# Patient Record
Sex: Female | Born: 1960 | Race: Black or African American | Hispanic: No | Marital: Married | Smoking: Current every day smoker
Health system: Southern US, Community
[De-identification: ages and names within clinical notes are randomized; demographics above are authoritative.]

## PROBLEM LIST (undated history)

## (undated) DIAGNOSIS — E119 Type 2 diabetes mellitus without complications: Secondary | ICD-10-CM

## (undated) DIAGNOSIS — I10 Essential (primary) hypertension: Secondary | ICD-10-CM

## (undated) DIAGNOSIS — K219 Gastro-esophageal reflux disease without esophagitis: Secondary | ICD-10-CM

## (undated) DIAGNOSIS — F329 Major depressive disorder, single episode, unspecified: Secondary | ICD-10-CM

## (undated) DIAGNOSIS — E1129 Type 2 diabetes mellitus with other diabetic kidney complication: Secondary | ICD-10-CM

## (undated) DIAGNOSIS — G4730 Sleep apnea, unspecified: Secondary | ICD-10-CM

## (undated) DIAGNOSIS — F32A Depression, unspecified: Secondary | ICD-10-CM

## (undated) DIAGNOSIS — R809 Proteinuria, unspecified: Secondary | ICD-10-CM

## (undated) DIAGNOSIS — E785 Hyperlipidemia, unspecified: Secondary | ICD-10-CM

## (undated) HISTORY — DX: Essential (primary) hypertension: I10

## (undated) HISTORY — DX: Gastro-esophageal reflux disease without esophagitis: K21.9

## (undated) HISTORY — DX: Hyperlipidemia, unspecified: E78.5

## (undated) HISTORY — DX: Type 2 diabetes mellitus without complications: E11.9

## (undated) HISTORY — DX: Major depressive disorder, single episode, unspecified: F32.9

## (undated) HISTORY — DX: Proteinuria, unspecified: R80.9

## (undated) HISTORY — DX: Type 2 diabetes mellitus with other diabetic kidney complication: E11.29

## (undated) HISTORY — DX: Depression, unspecified: F32.A

---

## 2005-05-28 HISTORY — PX: DILATION AND CURETTAGE OF UTERUS: SHX78

## 2005-08-22 ENCOUNTER — Ambulatory Visit: Payer: Self-pay | Admitting: Family Medicine

## 2005-09-20 ENCOUNTER — Ambulatory Visit: Payer: Self-pay | Admitting: Unknown Physician Specialty

## 2007-05-14 ENCOUNTER — Ambulatory Visit: Payer: Self-pay | Admitting: Family Medicine

## 2007-12-08 ENCOUNTER — Ambulatory Visit: Payer: Self-pay | Admitting: Family Medicine

## 2008-08-18 DIAGNOSIS — E782 Mixed hyperlipidemia: Secondary | ICD-10-CM | POA: Insufficient documentation

## 2008-09-02 ENCOUNTER — Ambulatory Visit: Payer: Self-pay | Admitting: Physician Assistant

## 2009-02-08 ENCOUNTER — Ambulatory Visit: Payer: Self-pay | Admitting: Family Medicine

## 2010-03-08 DIAGNOSIS — L301 Dyshidrosis [pompholyx]: Secondary | ICD-10-CM | POA: Insufficient documentation

## 2011-03-09 LAB — HM MAMMOGRAPHY: HM Mammogram: NORMAL

## 2011-03-20 ENCOUNTER — Ambulatory Visit: Payer: Self-pay | Admitting: Family Medicine

## 2011-04-17 LAB — HM PAP SMEAR: HM Pap smear: HIGH

## 2011-07-17 ENCOUNTER — Ambulatory Visit: Payer: Self-pay | Admitting: Family Medicine

## 2011-08-24 ENCOUNTER — Ambulatory Visit: Payer: Self-pay | Admitting: Family Medicine

## 2011-11-19 ENCOUNTER — Ambulatory Visit: Payer: Self-pay

## 2012-02-19 ENCOUNTER — Ambulatory Visit: Payer: Self-pay | Admitting: Family Medicine

## 2012-02-26 ENCOUNTER — Ambulatory Visit: Payer: Self-pay | Admitting: Family Medicine

## 2012-03-20 ENCOUNTER — Ambulatory Visit: Payer: Self-pay | Admitting: Family Medicine

## 2012-03-28 ENCOUNTER — Ambulatory Visit: Payer: Self-pay | Admitting: Family Medicine

## 2012-08-28 ENCOUNTER — Telehealth: Payer: Self-pay | Admitting: *Deleted

## 2012-08-28 NOTE — Telephone Encounter (Signed)
Patient was contacted to see if we could arrange for a colonoscopy in May 2014. This patient declined at this time. She states she is in an ongoing issue with [facility] about a bill for something she did not have completed. Patient wishes to wait until this has been straightened out before she proceeds with colonoscopy. This patient is aware to contact the office when she is ready to arrange.

## 2012-12-29 ENCOUNTER — Ambulatory Visit: Payer: Self-pay | Admitting: Family Medicine

## 2014-04-07 LAB — HEMOGLOBIN A1C: Hgb A1c MFr Bld: 6.9 % — AB (ref 4.0–6.0)

## 2014-04-09 LAB — LIPID PANEL
Cholesterol: 151 mg/dL (ref 0–200)
HDL: 53 mg/dL (ref 35–70)
LDL CALC: 81 mg/dL
Triglycerides: 86 mg/dL (ref 40–160)

## 2014-09-14 ENCOUNTER — Encounter: Payer: Self-pay | Admitting: Family Medicine

## 2014-11-03 ENCOUNTER — Ambulatory Visit (INDEPENDENT_AMBULATORY_CARE_PROVIDER_SITE_OTHER): Payer: BLUE CROSS/BLUE SHIELD | Admitting: Family Medicine

## 2014-11-03 ENCOUNTER — Encounter (INDEPENDENT_AMBULATORY_CARE_PROVIDER_SITE_OTHER): Payer: Self-pay

## 2014-11-03 ENCOUNTER — Encounter: Payer: Self-pay | Admitting: Family Medicine

## 2014-11-03 VITALS — BP 124/80 | HR 108 | Temp 97.5°F | Resp 16 | Ht 62.0 in | Wt 264.2 lb

## 2014-11-03 DIAGNOSIS — R87610 Atypical squamous cells of undetermined significance on cytologic smear of cervix (ASC-US): Secondary | ICD-10-CM | POA: Insufficient documentation

## 2014-11-03 DIAGNOSIS — J302 Other seasonal allergic rhinitis: Secondary | ICD-10-CM | POA: Insufficient documentation

## 2014-11-03 DIAGNOSIS — F329 Major depressive disorder, single episode, unspecified: Secondary | ICD-10-CM | POA: Diagnosis not present

## 2014-11-03 DIAGNOSIS — K219 Gastro-esophageal reflux disease without esophagitis: Secondary | ICD-10-CM

## 2014-11-03 DIAGNOSIS — E1165 Type 2 diabetes mellitus with hyperglycemia: Secondary | ICD-10-CM

## 2014-11-03 DIAGNOSIS — M549 Dorsalgia, unspecified: Secondary | ICD-10-CM | POA: Insufficient documentation

## 2014-11-03 DIAGNOSIS — G4733 Obstructive sleep apnea (adult) (pediatric): Secondary | ICD-10-CM

## 2014-11-03 DIAGNOSIS — I10 Essential (primary) hypertension: Secondary | ICD-10-CM | POA: Diagnosis not present

## 2014-11-03 DIAGNOSIS — F32A Depression, unspecified: Secondary | ICD-10-CM | POA: Insufficient documentation

## 2014-11-03 DIAGNOSIS — E668 Other obesity: Secondary | ICD-10-CM | POA: Insufficient documentation

## 2014-11-03 DIAGNOSIS — IMO0002 Reserved for concepts with insufficient information to code with codable children: Secondary | ICD-10-CM

## 2014-11-03 DIAGNOSIS — E282 Polycystic ovarian syndrome: Secondary | ICD-10-CM | POA: Insufficient documentation

## 2014-11-03 DIAGNOSIS — IMO0001 Reserved for inherently not codable concepts without codable children: Secondary | ICD-10-CM | POA: Insufficient documentation

## 2014-11-03 LAB — GLUCOSE, POCT (MANUAL RESULT ENTRY): POC Glucose: 150 mg/dL — AB (ref 70–99)

## 2014-11-03 LAB — POCT GLYCOSYLATED HEMOGLOBIN (HGB A1C): Hemoglobin A1C: 7.5

## 2014-11-03 MED ORDER — GLIPIZIDE ER 10 MG PO TB24: 10.0000 mg | ORAL_TABLET | Freq: Two times a day (BID) | ORAL | Status: DC

## 2014-11-03 NOTE — Progress Notes (Signed)
Name: Ariel ShanBridgette F Plotts   MRN: [id number]    DOB: 02/08/1961   Date:11/03/2014       Progress Note  Subjective  Chief Complaint  Chief Complaint  Patient presents with  . Hypertension  . Diabetes  . Hyperlipidemia  . Obesity    Hypertension This is a chronic problem. The current episode started more than 1 year ago. Pertinent negatives include no blurred vision, chest pain, headaches, neck pain, orthopnea, palpitations or shortness of breath. There are no associated agents to hypertension. Risk factors for coronary artery disease include diabetes mellitus, dyslipidemia, obesity and sedentary lifestyle. Past treatments include angiotensin blockers and diuretics. The current treatment provides moderate improvement. There are no compliance problems.   Diabetes She presents for her follow-up diabetic visit. She has type 2 diabetes mellitus. Her disease course has been worsening. There are no hypoglycemic associated symptoms. Pertinent negatives for hypoglycemia include no dizziness, headaches, nervousness/anxiousness, seizures or tremors. Pertinent negatives for diabetes include no blurred vision, no chest pain, no polydipsia, no polyuria, no weakness and no weight loss. There are no hypoglycemic complications. Symptoms are worsening. Risk factors for coronary artery disease include diabetes mellitus and stress. Her weight is increasing steadily. She is following a diabetic diet. She has had a previous visit with a dietitian. She rarely participates in exercise. Her overall blood glucose range is 140-180 mg/dl. An ACE inhibitor/angiotensin II receptor blocker is being taken.  Hyperlipidemia This is a chronic problem. The current episode started more than 1 year ago. The problem is controlled. Recent lipid tests were reviewed and are normal. Exacerbating diseases include diabetes and obesity. Factors aggravating her hyperlipidemia include thiazides. Pertinent negatives include no chest pain, focal  weakness, myalgias or shortness of breath. Current antihyperlipidemic treatment includes statins. The current treatment provides moderate improvement of lipids. There are no compliance problems.  Risk factors for coronary artery disease include diabetes mellitus, dyslipidemia, hypertension and obesity.    OBESITY Patient admits to dietary indiscretion. She has not been following her diet as prescribed. She has seen the dietitian within the last year. She has just began an exercise regimen again.   Past Medical History  Diagnosis Date  . GERD (gastroesophageal reflux disease)   . Depression   . Hypertension   . Hyperlipidemia   . Diabetes mellitus without complication     History  Substance Use Topics  . Smoking status: Current Every Day Smoker -- 0.50 packs/day  . Smokeless tobacco: Not on file  . Alcohol Use: No     Current outpatient prescriptions:  .  atorvastatin (LIPITOR) 40 MG tablet, Take 40 mg by mouth daily., Disp: , Rfl:  .  canagliflozin (INVOKANA) 100 MG TABS tablet, Take 100 mg by mouth daily., Disp: , Rfl:  .  losartan-hydrochlorothiazide (HYZAAR) 100-12.5 MG per tablet, Take 1 tablet by mouth daily., Disp: , Rfl:  .  sitaGLIPtin-metformin (JANUMET) 50-1000 MG per tablet, Take 1 tablet by mouth 2 (two) times daily with a meal., Disp: , Rfl:  .  glipiZIDE (GLUCOTROL XL) 10 MG 24 hr tablet, Take 10 mg by mouth daily with breakfast., Disp: , Rfl:   Allergies  Allergen Reactions  . Victoza [Liraglutide] Rash    rash    Review of Systems  Constitutional: Negative for fever, chills and weight loss.  HENT: Negative for congestion, hearing loss, sore throat and tinnitus.   Eyes: Negative for blurred vision, double vision and redness.  Respiratory: Negative for cough, hemoptysis and shortness of breath.  Cardiovascular: Negative for chest pain, palpitations, orthopnea, claudication and leg swelling.  Gastrointestinal: Negative for heartburn, nausea, vomiting, diarrhea,  constipation and blood in stool.  Genitourinary: Negative for dysuria, urgency, frequency and hematuria.  Musculoskeletal: Negative for myalgias, back pain, joint pain, falls and neck pain.  Skin: Negative for itching.  Neurological: Negative for dizziness, tingling, tremors, focal weakness, seizures, loss of consciousness, weakness and headaches.  Endo/Heme/Allergies: Negative for polydipsia. Does not bruise/bleed easily.       WT GAIN  Psychiatric/Behavioral: Negative for depression and substance abuse. The patient is not nervous/anxious and does not have insomnia.      Objective  Filed Vitals:   11/03/14 0802  BP: 124/80  Pulse: 108  Temp: 97.5 F (36.4 C)  TempSrc: Oral  Resp: 16  Height:  (1.575 m)  Weight: 264 lb 3.2 oz (119.84 kg)  SpO2: 95%     Physical Exam  Constitutional: She is oriented to person, place, and time and well-developed, well-nourished, and in no distress.  HENT:  Head: Normocephalic.  Eyes: EOM are normal. Pupils are equal, round, and reactive to light.  Neck: Normal range of motion. No thyromegaly present.  Cardiovascular: Normal rate, regular rhythm and normal heart sounds.   No murmur heard. Pulmonary/Chest: Effort normal and breath sounds normal.  Abdominal: Soft. Bowel sounds are normal.  Musculoskeletal: Normal range of motion. She exhibits no edema.  Neurological: She is alert and oriented to person, place, and time. No cranial nerve deficit. Gait normal.  Skin: Skin is warm and dry. No rash noted.  Psychiatric: Memory and affect normal.      Assessment & PlaN  1. Uncontrolled diabetes mellitus - POCT HgB A1C - POCT Glucose (CBG) - Fructosamine  2. Benign essential HTN Controlled  3. Obstructive apnea Stable  4. Gastroesophageal reflux disease without esophagitis Stable  5. Clinical depression Stable  6. Extreme obesity Worsening

## 2014-11-03 NOTE — Patient Instructions (Signed)
F/u 1 mo

## 2014-11-04 LAB — FRUCTOSAMINE: FRUCTOSAMINE: 287 umol/L — AB (ref 0–285)

## 2014-12-09 ENCOUNTER — Ambulatory Visit: Payer: BLUE CROSS/BLUE SHIELD | Admitting: Family Medicine

## 2014-12-29 ENCOUNTER — Ambulatory Visit: Payer: BLUE CROSS/BLUE SHIELD | Admitting: Family Medicine

## 2015-01-18 ENCOUNTER — Ambulatory Visit (INDEPENDENT_AMBULATORY_CARE_PROVIDER_SITE_OTHER): Payer: BLUE CROSS/BLUE SHIELD | Admitting: Family Medicine

## 2015-01-18 ENCOUNTER — Encounter: Payer: Self-pay | Admitting: Family Medicine

## 2015-01-18 VITALS — BP 124/68 | HR 104 | Temp 98.1°F | Resp 16 | Ht 62.0 in | Wt 257.8 lb

## 2015-01-18 DIAGNOSIS — Z23 Encounter for immunization: Secondary | ICD-10-CM | POA: Diagnosis not present

## 2015-01-18 DIAGNOSIS — E1165 Type 2 diabetes mellitus with hyperglycemia: Secondary | ICD-10-CM

## 2015-01-18 DIAGNOSIS — Z1211 Encounter for screening for malignant neoplasm of colon: Secondary | ICD-10-CM

## 2015-01-18 DIAGNOSIS — F329 Major depressive disorder, single episode, unspecified: Secondary | ICD-10-CM

## 2015-01-18 DIAGNOSIS — E785 Hyperlipidemia, unspecified: Secondary | ICD-10-CM | POA: Diagnosis not present

## 2015-01-18 DIAGNOSIS — I10 Essential (primary) hypertension: Secondary | ICD-10-CM | POA: Diagnosis not present

## 2015-01-18 DIAGNOSIS — IMO0002 Reserved for concepts with insufficient information to code with codable children: Secondary | ICD-10-CM

## 2015-01-18 DIAGNOSIS — F32A Depression, unspecified: Secondary | ICD-10-CM

## 2015-01-18 LAB — GLUCOSE, POCT (MANUAL RESULT ENTRY): POC GLUCOSE: 215 mg/dL — AB (ref 70–99)

## 2015-01-18 LAB — POCT GLYCOSYLATED HEMOGLOBIN (HGB A1C): Hemoglobin A1C: 10.2

## 2015-01-18 MED ORDER — SITAGLIPTIN-METFORMIN HCL 50-1000 MG PO TABS
1.0000 | ORAL_TABLET | Freq: Two times a day (BID) | ORAL | Status: DC
Start: 2015-01-18 — End: 2015-08-09

## 2015-01-18 NOTE — Progress Notes (Addendum)
Name: Ariel Johnston   MRN: [id number]    DOB: 14-Dec-1960   Date:01/18/2015       Progress Note  Subjective  Chief Complaint  Chief Complaint  Patient presents with  . Hypertension    3 month follow up  . Diabetes  . Hyperlipidemia    Hypertension Pertinent negatives include no blurred vision, chest pain, headaches, neck pain, orthopnea, palpitations or shortness of breath.  Diabetes Pertinent negatives for hypoglycemia include no dizziness, headaches, nervousness/anxiousness, seizures or tremors. Associated symptoms include polydipsia. Pertinent negatives for diabetes include no blurred vision, no chest pain, no weakness and no weight loss.  Hyperlipidemia Pertinent negatives include no chest pain, focal weakness, myalgias or shortness of breath.   History of hyperlipidemia for greater than 5 years. Patient currently on atorvastatin 40 mg by mouth every morning she has switch she follows with some frequency. No muscle myalgias or nausea or vomiting associated with this usage. Cardiac risk factors include hyperlipidemia diabetes hypertension obesity and sedentary lifestyle.  Diabetes patient currently on glipizide 10 mg daily. She also is on Janumet 50-101 by mouth twice a day [place] was not paid for by her insurance. Blood sugars have not been checked with regularity she admits to dietary indiscretion and not being completely compliant with her meds since last visit. Her weight today 264 please decreased 7 pounds but she has been having polyuria with elevated glucose of recent.  Hypertension  Patient currently on a regimen of losartan HCT. She states she is taking on a regular basis as well as an aspirin once daily. Currently no chest pain palpitations orthopnea PND or lower extremity swelling is complaint of.  Obesity  Patient admits to dietary indiscretion and is not exercising with any regularity. Her weight today of 257.8 BMI of 48.   Past Medical History  Diagnosis Date   . GERD (gastroesophageal reflux disease)   . Depression   . Hypertension   . Hyperlipidemia   . Diabetes mellitus without complication     Social History  Substance Use Topics  . Smoking status: Current Every Day Smoker -- 0.50 packs/day  . Smokeless tobacco: Not on file  . Alcohol Use: No     Current outpatient prescriptions:  .  aspirin (ASPIRIN ADULT LOW DOSE) 81 MG EC tablet, Take by mouth., Disp: , Rfl:  .  atorvastatin (LIPITOR) 40 MG tablet, Take 40 mg by mouth daily., Disp: , Rfl:  .  canagliflozin (INVOKANA) 100 MG TABS tablet, Take 100 mg by mouth daily., Disp: , Rfl:  .  glipiZIDE (GLUCOTROL XL) 10 MG 24 hr tablet, Take 1 tablet (10 mg total) by mouth 2 (two) times daily. 1 bid, Disp: 180 tablet, Rfl: 1 .  losartan-hydrochlorothiazide (HYZAAR) 100-12.5 MG per tablet, Take 1 tablet by mouth daily., Disp: , Rfl:   Allergies  Allergen Reactions  . Victoza [Liraglutide] Rash    rash    Review of Systems  Constitutional: Negative for fever, chills and weight loss.  HENT: Negative for congestion, hearing loss, sore throat and tinnitus.   Eyes: Negative for blurred vision, double vision and redness.  Respiratory: Negative for cough, hemoptysis and shortness of breath.   Cardiovascular: Negative for chest pain, palpitations, orthopnea, claudication and leg swelling.  Gastrointestinal: Negative for heartburn, nausea, vomiting, diarrhea, constipation and blood in stool.  Genitourinary: Positive for frequency. Negative for dysuria, urgency and hematuria.  Musculoskeletal: Negative for myalgias, back pain, joint pain, falls and neck pain.  Skin: Negative for itching.  Neurological: Negative for dizziness, tingling, tremors, focal weakness, seizures, loss of consciousness, weakness and headaches.  Endo/Heme/Allergies: Positive for polydipsia. Does not bruise/bleed easily.  Psychiatric/Behavioral: Negative for depression and substance abuse. The patient is not nervous/anxious  and does not have insomnia.      Objective  Filed Vitals:   01/18/15 0803  BP: 124/68  Pulse: 104  Temp: 98.1 F (36.7 C)  TempSrc: Oral  Resp: 16  Height:  (1.575 m)  Weight: 257 lb 12.8 oz (116.937 kg)  SpO2: 96%     Physical Exam  Constitutional: She is oriented to person, place, and time and well-developed, well-nourished, and in no distress.  HENT:  Head: Normocephalic.  Eyes: EOM are normal. Pupils are equal, round, and reactive to light.  Neck: Normal range of motion. No thyromegaly present.  Cardiovascular: Normal rate, regular rhythm, normal heart sounds and intact distal pulses.   No murmur heard. Pulmonary/Chest: Effort normal and breath sounds normal.  Abdominal: Soft. Bowel sounds are normal.  Musculoskeletal: Normal range of motion. She exhibits no edema.  Neurological: She is alert and oriented to person, place, and time. No cranial nerve deficit. Gait normal.  Skin: Skin is warm and dry. No rash noted.  Psychiatric: Memory and affect normal.      Assessment & Plan  1. Uncontrolled diabetes mellitus Again encouraged need for compliance, and exercise medication - POCT HgB A1C - POCT Glucose (CBG) - Comprehensive metabolic panel - TSH - sitaGLIPtin-metformin (JANUMET) 50-1000 MG  per tablet; Take 1 tablet by mouth 2 (two) times daily with a meal.  Dispense: 60 tablet; Refill: 1  2. Essential hypertension  - Comprehensive metabolic panel - TSH  3. Hyperlipidemia  - Lipid panel - TSH  4. Clinical depression   5. Need for immunization against influenza  - Flu Vaccine QUAD 36+ mos PF IM (Fluarix & Fluzone Quad PF)  6. Encounter for screening colonoscopy  - Ambulatory referral to Colorectal Surgery

## 2015-01-20 DIAGNOSIS — I10 Essential (primary) hypertension: Secondary | ICD-10-CM | POA: Insufficient documentation

## 2015-01-20 DIAGNOSIS — Z23 Encounter for immunization: Secondary | ICD-10-CM | POA: Insufficient documentation

## 2015-01-20 DIAGNOSIS — E785 Hyperlipidemia, unspecified: Secondary | ICD-10-CM | POA: Insufficient documentation

## 2015-01-20 DIAGNOSIS — Z1211 Encounter for screening for malignant neoplasm of colon: Secondary | ICD-10-CM | POA: Insufficient documentation

## 2015-02-04 ENCOUNTER — Telehealth: Payer: Self-pay | Admitting: Emergency Medicine

## 2015-02-04 LAB — COMPREHENSIVE METABOLIC PANEL
ALK PHOS: 62 IU/L (ref 39–117)
ALT: 24 IU/L (ref 0–32)
AST: 16 IU/L (ref 0–40)
Albumin/Globulin Ratio: 1.3 (ref 1.1–2.5)
Albumin: 4.1 g/dL (ref 3.5–5.5)
BILIRUBIN TOTAL: 0.3 mg/dL (ref 0.0–1.2)
BUN / CREAT RATIO: 17 (ref 9–23)
BUN: 14 mg/dL (ref 6–24)
CHLORIDE: 98 mmol/L (ref 97–108)
CO2: 23 mmol/L (ref 18–29)
Calcium: 9.5 mg/dL (ref 8.7–10.2)
Creatinine, Ser: 0.81 mg/dL (ref 0.57–1.00)
GFR calc non Af Amer: 83 mL/min/{1.73_m2} (ref >59)
GFR, EST AFRICAN AMERICAN: 96 mL/min/{1.73_m2} (ref >59)
Globulin, Total: 3.2 g/dL (ref 1.5–4.5)
Glucose: 162 mg/dL — ABNORMAL HIGH (ref 65–99)
Potassium: 4.4 mmol/L (ref 3.5–5.2)
Sodium: 141 mmol/L (ref 134–144)
Total Protein: 7.3 g/dL (ref 6.0–8.5)

## 2015-02-04 LAB — TSH: TSH: 1.11 u[IU]/mL (ref 0.450–4.500)

## 2015-02-04 LAB — LIPID PANEL
CHOLESTEROL TOTAL: 114 mg/dL (ref 100–199)
Chol/HDL Ratio: 2.7 ratio units (ref 0.0–4.4)
HDL: 43 mg/dL (ref >39)
LDL Calculated: 50 mg/dL (ref 0–99)
Triglycerides: 106 mg/dL (ref 0–149)
VLDL Cholesterol Cal: 21 mg/dL (ref 5–40)

## 2015-02-04 NOTE — Telephone Encounter (Signed)
Patient notified of labs.   

## 2015-02-04 NOTE — Telephone Encounter (Signed)
Patient notified

## 2015-03-07 ENCOUNTER — Other Ambulatory Visit: Payer: Self-pay | Admitting: Family Medicine

## 2015-04-14 ENCOUNTER — Ambulatory Visit (INDEPENDENT_AMBULATORY_CARE_PROVIDER_SITE_OTHER): Payer: BLUE CROSS/BLUE SHIELD | Admitting: Family Medicine

## 2015-04-14 ENCOUNTER — Encounter: Payer: Self-pay | Admitting: Family Medicine

## 2015-04-14 VITALS — BP 128/78 | HR 113 | Temp 98.5°F | Resp 18 | Ht 62.0 in | Wt 255.0 lb

## 2015-04-14 DIAGNOSIS — I10 Essential (primary) hypertension: Secondary | ICD-10-CM | POA: Diagnosis not present

## 2015-04-14 DIAGNOSIS — Z720 Tobacco use: Secondary | ICD-10-CM | POA: Diagnosis not present

## 2015-04-14 DIAGNOSIS — E1169 Type 2 diabetes mellitus with other specified complication: Secondary | ICD-10-CM

## 2015-04-14 DIAGNOSIS — E785 Hyperlipidemia, unspecified: Secondary | ICD-10-CM | POA: Diagnosis not present

## 2015-04-14 DIAGNOSIS — Z716 Tobacco abuse counseling: Secondary | ICD-10-CM | POA: Diagnosis not present

## 2015-04-14 LAB — POCT GLYCOSYLATED HEMOGLOBIN (HGB A1C): Hemoglobin A1C: 8.5

## 2015-04-14 LAB — POCT UA - MICROALBUMIN: Microalbumin Ur, POC: 20 mg/L

## 2015-04-14 LAB — GLUCOSE, POCT (MANUAL RESULT ENTRY): POC GLUCOSE: 234 mg/dL — AB (ref 70–99)

## 2015-04-14 MED ORDER — VARENICLINE TARTRATE 0.5 MG PO TABS: 0.5000 mg | ORAL_TABLET | Freq: Two times a day (BID) | ORAL | Status: DC

## 2015-04-14 NOTE — Progress Notes (Signed)
Name: Ariel Johnston   MRN: [id number]    DOB: Oct 22, 1960   Date:04/14/2015       Progress Note  Subjective  Chief Complaint  Chief Complaint  Patient presents with  . Hypertension  . Hyperlipidemia  . Diabetes    HPI  Diabetes  Patient presents for follow-up of hypertension. It has been present for over over 5 years.  Patient states that there is compliance with medical regimen which consists of Janumet 50- and 1000 1000 and glipizide 10 mg g. There is no end organ disease. Cardiac risk factors include hypertension hyperlipidemia and diabetes.  Exercise regimen consist of minimal .  Diet consist of ADA has been prescribed .  Hyperlipidemia  Patient has a history of hyperlipideover 5ars.  Current medical regimen consist of atorvastatin 40 mg daily at bedtimeCompliance GoodDiet and exercise are currently fnot wellk factors for cardiovascular disease include hyperlipidehypertension sedentary lifestyle hyperlipidemia hypertension. There have been no side effects from the medication.    Hypertension   Patient presents for follow-up of hypertension. It has been present for over 5 years.  Patient states that there is compliance with medical regimen which consists of Hyzaar 100-12 0.5 daily . There is no end organ disease. Cardiac risk factors include hypertension hyperlipidemia and diabetes.  Exercise regimen consist of minimal walking .  Diet consist of some salt restriction .Marland Kitchen   Obesity  Patient has had a long-standing history of obesity. She has been referred to dietitian past. Encouraged to be on a regular aerobic exercise. Her weight today of 255 is decreased 3 pounds. It is worsened by dietary indiscretion and lack of exercise  Tobacco abuse  Patient has a several day History of smoking as much as 1/2-1 pack per day. She denies seriously interested in stopping smoking and is considering using Chantix for this reason. She had her husband have both discussed stopping together as  of the end of this year  Past Medical History  Diagnosis Date  . GERD (gastroesophageal reflux disease)   . Depression   . Hypertension   . Hyperlipidemia   . Diabetes mellitus without complication [facility])     Social History  Substance Use Topics  . Smoking status: Current Every Day Smoker -- 0.50 packs/day  . Smokeless tobacco: Not on file  . Alcohol Use: No     Current outpatient prescriptions:  .  aspirin (ASPIRIN ADULT LOW DOSE) 81 MG EC tablet, Take by mouth., Disp: , Rfl:  .  atorvastatin (LIPITOR) 40 MG tablet, Take 40 mg by mouth daily., Disp: , Rfl:  .  glipiZIDE (GLUCOTROL XL) 10 MG 24 hr tablet, Take 1 tablet (10 mg total) by mouth 2 (two) times daily. 1 bid, Disp: 180 tablet, Rfl: 1 .  INVOKANA 100 MG TABS tablet, TAKE 1 TABLET DAILY, Disp: 90 tablet, Rfl: 0 .  losartan-hydrochlorothiazide (HYZAAR) 100-12.5 MG per tablet, Take 1 tablet by mouth daily., Disp: , Rfl:  .  sitaGLIPtin-metformin (JANUMET) 50-1000 MG per tablet, Take 1 tablet by mouth 2 (two) times daily with a meal., Disp: 60 tablet, Rfl: 1  Allergies  Allergen Reactions  . Victoza [Liraglutide] Rash    rash    Review of Systems  Constitutional: Negative for fever, chills and weight loss.  HENT: Negative for congestion, hearing loss, sore throat and tinnitus.   Eyes: Negative for blurred vision, double vision and redness.  Respiratory: Negative for cough, hemoptysis and shortness of breath.   Cardiovascular: Negative for chest pain, palpitations, orthopnea,  claudication and leg swelling.  Gastrointestinal: Negative for heartburn, nausea, vomiting, diarrhea, constipation and blood in stool.  Genitourinary: Negative for dysuria, urgency, frequency and hematuria.  Musculoskeletal: Positive for joint pain. Negative for myalgias, back pain, falls and neck pain.  Skin: Negative for itching.  Neurological: Negative for dizziness, tingling, tremors, focal weakness, seizures, loss of consciousness, weakness and  headaches.  Endo/Heme/Allergies: Does not bruise/bleed easily.  Psychiatric/Behavioral: Negative for depression and substance abuse. The patient is not nervous/anxious and does not have insomnia.      Objective  Filed Vitals:   04/14/15 0811  BP: 128/78  Pulse: 113  Temp: 98.5 F (36.9 C)  Resp: 18  Height: 5\' 2"  (1.575 m)  Weight: 255 lb (115.667 kg)  SpO2: 96%     Physical Exam  Constitutional: She is oriented to person, place, and time.  Morbidly obese and in no acute distress  HENT:  Head: Normocephalic.  Eyes: EOM are normal. Pupils are equal, round, and reactive to light.  Neck: Normal range of motion. No thyromegaly present.  Cardiovascular: Normal rate, regular rhythm and normal heart sounds.   No murmur heard. Pulmonary/Chest: Effort normal and breath sounds normal.  Abdominal: Soft. Bowel sounds are normal.  Musculoskeletal: Normal range of motion. She exhibits no edema.  Neurological: She is alert and oriented to person, place, and time. No cranial nerve deficit. Gait normal.  Skin: Skin is warm and dry. No rash noted.  Psychiatric: Memory and affect normal.      Assessment & Plan  1. Type 2 diabetes mellitus with other specified complication (HCC) Uncontrolled encourage diet and exercise compliance - POCT Glucose (CBG) - POCT HgB A1C  2. Essential hypertension Well-controlled  3. Morbid obesity due to excess calories (HCC) Encourage diet and exercise  4. Tobacco abuse Chantix and handout on smoking cessation and recheck in 2 months  5. Hyperlipidemia Well-controlled

## 2015-04-14 NOTE — Patient Instructions (Signed)
Smoking smoking cessationSmoking Cessation, Tips for Success If you are ready to quit smoking, congratulations! You have chosen to help yourself be healthier. Cigarettes bring nicotine, tar, carbon monoxide, and other irritants into your body. Your lungs, heart, and blood vessels will be able to work better without these poisons. There are many different ways to quit smoking. Nicotine gum, nicotine patches, a nicotine inhaler, or nicotine nasal spray can help with physical craving. Hypnosis, support groups, and medicines help break the habit of smoking. WHAT THINGS CAN I DO TO MAKE QUITTING EASIER?  Here are some tips to help you quit for good:  Pick a date when you will quit smoking completely. Tell all of your friends and family about your plan to quit on that date.  Do not try to slowly cut down on the number of cigarettes you are smoking. Pick a quit date and quit smoking completely starting on that day.  Throw away all cigarettes.   Clean and remove all ashtrays from your home, work, and car.  On a card, write down your reasons for quitting. Carry the card with you and read it when you get the urge to smoke.  Cleanse your body of nicotine. Drink enough water and fluids to keep your urine clear or pale yellow. Do this after quitting to flush the nicotine from your body.  Learn to predict your moods. Do not let a bad situation be your excuse to have a cigarette. Some situations in your life might tempt you into wanting a cigarette.  Never have "just one" cigarette. It leads to wanting another and another. Remind yourself of your decision to quit.  Change habits associated with smoking. If you smoked while driving or when feeling stressed, try other activities to replace smoking. Stand up when drinking your coffee. Brush your teeth after eating. Sit in a different chair when you read the paper. Avoid alcohol while trying to quit, and try to drink fewer caffeinated beverages. Alcohol and  caffeine may urge you to smoke.  Avoid foods and drinks that can trigger a desire to smoke, such as sugary or spicy foods and alcohol.  Ask people who smoke not to smoke around you.  Have something planned to do right after eating or having a cup of coffee. For example, plan to take a walk or exercise.  Try a relaxation exercise to calm you down and decrease your stress. Remember, you may be tense and nervous for the first 2 weeks after you quit, but this will pass.  Find new activities to keep your hands busy. Play with a pen, coin, or rubber band. Doodle or draw things on paper.  Brush your teeth right after eating. This will help cut down on the craving for the taste of tobacco after meals. You can also try mouthwash.   Use oral substitutes in place of cigarettes. Try using lemon drops, carrots, cinnamon sticks, or chewing gum. Keep them handy so they are available when you have the urge to smoke.  When you have the urge to smoke, try deep breathing.  Designate your home as a nonsmoking area.  If you are a heavy smoker, ask your health care provider about a prescription for nicotine chewing gum. It can ease your withdrawal from nicotine.  Reward yourself. Set aside the cigarette money you save and buy yourself something nice.  Look for support from others. Join a support group or smoking cessation program. Ask someone at home or at work to help you with  your plan to quit smoking.  Always ask yourself, "Do I need this cigarette or is this just a reflex?" Tell yourself, "Today, I choose not to smoke," or "I do not want to smoke." You are reminding yourself of your decision to quit.  Do not replace cigarette smoking with electronic cigarettes (commonly called e-cigarettes). The safety of e-cigarettes is unknown, and some may contain harmful chemicals.  If you relapse, do not give up! Plan ahead and think about what you will do the next time you get the urge to smoke. HOW WILL I FEEL  WHEN I QUIT SMOKING? You may have symptoms of withdrawal because your body is used to nicotine (the addictive substance in cigarettes). You may crave cigarettes, be irritable, feel very hungry, cough often, get headaches, or have difficulty concentrating. The withdrawal symptoms are only temporary. They are strongest when you first quit but will go away within 10-14 days. When withdrawal symptoms occur, stay in control. Think about your reasons for quitting. Remind yourself that these are signs that your body is healing and getting used to being without cigarettes. Remember that withdrawal symptoms are easier to treat than the major diseases that smoking can cause.  Even after the withdrawal is over, expect periodic urges to smoke. However, these cravings are generally short lived and will go away whether you smoke or not. Do not smoke! WHAT RESOURCES ARE AVAILABLE TO HELP ME QUIT SMOKING? Your health care provider can direct you to community resources or hospitals for support, which may include:  Group support.  Education.  Hypnosis.  Therapy.   This information is not intended to replace advice given to you by your health care provider. Make sure you discuss any questions you have with your health care provider.   Document Released: 02/10/2004 Document Revised: 06/04/2014 Document Reviewed: 10/30/2012 [employer] Interactive Patient Education [employer]

## 2015-05-19 LAB — HM PAP SMEAR: HM Pap smear: NORMAL

## 2015-06-01 ENCOUNTER — Encounter: Payer: Self-pay | Admitting: Family Medicine

## 2015-06-16 ENCOUNTER — Ambulatory Visit: Payer: BLUE CROSS/BLUE SHIELD | Admitting: Family Medicine

## 2015-06-20 ENCOUNTER — Other Ambulatory Visit: Payer: Self-pay | Admitting: Family Medicine

## 2015-08-09 ENCOUNTER — Other Ambulatory Visit: Payer: Self-pay | Admitting: Family Medicine

## 2015-08-16 ENCOUNTER — Ambulatory Visit: Payer: BLUE CROSS/BLUE SHIELD | Admitting: Family Medicine

## 2015-09-27 ENCOUNTER — Other Ambulatory Visit: Payer: Self-pay | Admitting: Family Medicine

## 2015-09-30 ENCOUNTER — Other Ambulatory Visit: Payer: Self-pay | Admitting: Emergency Medicine

## 2015-09-30 MED ORDER — LOSARTAN POTASSIUM-HCTZ 100-12.5 MG PO TABS: 1.0000 | ORAL_TABLET | Freq: Every day | ORAL | Status: DC

## 2015-09-30 MED ORDER — CANAGLIFLOZIN 100 MG PO TABS: 100.0000 mg | ORAL_TABLET | Freq: Every day | ORAL | Status: DC

## 2015-09-30 MED ORDER — GLIPIZIDE ER 10 MG PO TB24: 10.0000 mg | ORAL_TABLET | Freq: Two times a day (BID) | ORAL | Status: DC

## 2015-09-30 MED ORDER — ATORVASTATIN CALCIUM 40 MG PO TABS: 40.0000 mg | ORAL_TABLET | Freq: Every day | ORAL | Status: DC

## 2015-10-25 ENCOUNTER — Other Ambulatory Visit: Payer: Self-pay

## 2015-10-25 ENCOUNTER — Telehealth: Payer: Self-pay

## 2015-10-25 DIAGNOSIS — Z1211 Encounter for screening for malignant neoplasm of colon: Secondary | ICD-10-CM

## 2015-10-25 NOTE — Telephone Encounter (Signed)
Gastroenterology Pre-Procedure Review  Request Date: 12/06/15 Requesting Physician: Dr. Servando SnareWohl  PATIENT REVIEW QUESTIONS: The patient responded to the following health history questions as indicated:    1. Are you having any GI issues? no 2. Do you have a personal history of Polyps? no 3. Do you have a family history of Colon Cancer or Polyps? yes (Paternal Grandmother- Colon Cancer) 4. Diabetes Mellitus? Yes 5. Joint replacements in the past 12 months?no 6. Major health problems in the past 3 months?no 7. Any artificial heart valves, MVP, or defibrillator?no    MEDICATIONS & ALLERGIES:    Patient reports the following regarding taking any anticoagulation/antiplatelet therapy:   Plavix, Coumadin, Eliquis, Xarelto, Lovenox, Pradaxa, Brilinta, or Effient? no Aspirin? yes (Prophalxsis)  Patient confirms/reports the following medications:  Current Outpatient Prescriptions  Medication Sig Dispense Refill  . aspirin (ASPIRIN ADULT LOW DOSE) 81 MG EC tablet Take 81 mg by mouth daily.     Marland Kitchen. atorvastatin (LIPITOR) 40 MG tablet TAKE 1 TABLET AT BEDTIME 90 tablet 0  . canagliflozin (INVOKANA) 100 MG TABS tablet Take 1 tablet (100 mg total) by mouth daily. 90 tablet 0  . glipiZIDE (GLUCOTROL XL) 10 MG 24 hr tablet Take 1 tablet (10 mg total) by mouth 2 (two) times daily. 1 bid 180 tablet 0  . JANUMET 50-1000 MG tablet TAKE 1 TABLET TWICE A DAY 180 tablet 0  . losartan-hydrochlorothiazide (HYZAAR) 100-12.5 MG tablet Take 1 tablet by mouth daily. 90 tablet 0   No current facility-administered medications for this visit.    Patient confirms/reports the following allergies:  Allergies  Allergen Reactions  . Victoza [Liraglutide] Rash    rash    No orders of the defined types were placed in this encounter.    AUTHORIZATION INFORMATION Primary Insurance: 1D#: Group #:  Secondary Insurance: 1D#: Group #:  SCHEDULE INFORMATION: Date:  Time: Location:

## 2015-10-25 NOTE — Telephone Encounter (Signed)
Orders placed at this time. Information sent out in mail after address was verified.

## 2015-11-23 ENCOUNTER — Other Ambulatory Visit: Payer: Self-pay | Admitting: Family Medicine

## 2015-11-25 NOTE — Telephone Encounter (Signed)
Pt needs refill on Janumet. Pt is out and has an appt in July to see Dr Sherie DonLada

## 2015-11-26 NOTE — Telephone Encounter (Signed)
Reviewed last creatinine; Rx approved; thank you

## 2015-12-05 ENCOUNTER — Encounter: Payer: Self-pay | Admitting: *Deleted

## 2015-12-06 ENCOUNTER — Encounter: Payer: Self-pay | Admitting: *Deleted

## 2015-12-06 ENCOUNTER — Ambulatory Visit
Admission: RE | Admit: 2015-12-06 | Discharge: 2015-12-06 | Disposition: A | Discharge status: Home / Self Care | Payer: BLUE CROSS/BLUE SHIELD | Source: Ambulatory Visit | Attending: Gastroenterology | Admitting: Gastroenterology

## 2015-12-06 ENCOUNTER — Encounter
Admission: RE | Disposition: A | Discharge status: Home / Self Care | Payer: Self-pay | Source: Ambulatory Visit | Attending: Gastroenterology

## 2015-12-06 ENCOUNTER — Ambulatory Visit: Payer: BLUE CROSS/BLUE SHIELD | Admitting: Anesthesiology

## 2015-12-06 DIAGNOSIS — Z1211 Encounter for screening for malignant neoplasm of colon: Secondary | ICD-10-CM | POA: Diagnosis present

## 2015-12-06 DIAGNOSIS — K640 First degree hemorrhoids: Secondary | ICD-10-CM | POA: Insufficient documentation

## 2015-12-06 DIAGNOSIS — I10 Essential (primary) hypertension: Secondary | ICD-10-CM | POA: Diagnosis not present

## 2015-12-06 DIAGNOSIS — Z7982 Long term (current) use of aspirin: Secondary | ICD-10-CM | POA: Diagnosis not present

## 2015-12-06 DIAGNOSIS — K219 Gastro-esophageal reflux disease without esophagitis: Secondary | ICD-10-CM | POA: Insufficient documentation

## 2015-12-06 DIAGNOSIS — E785 Hyperlipidemia, unspecified: Secondary | ICD-10-CM | POA: Insufficient documentation

## 2015-12-06 DIAGNOSIS — E669 Obesity, unspecified: Secondary | ICD-10-CM | POA: Insufficient documentation

## 2015-12-06 DIAGNOSIS — Z7984 Long term (current) use of oral hypoglycemic drugs: Secondary | ICD-10-CM | POA: Insufficient documentation

## 2015-12-06 DIAGNOSIS — Z6841 Body Mass Index (BMI) 40.0 and over, adult: Secondary | ICD-10-CM | POA: Diagnosis not present

## 2015-12-06 DIAGNOSIS — Z79899 Other long term (current) drug therapy: Secondary | ICD-10-CM | POA: Diagnosis not present

## 2015-12-06 DIAGNOSIS — E119 Type 2 diabetes mellitus without complications: Secondary | ICD-10-CM | POA: Insufficient documentation

## 2015-12-06 DIAGNOSIS — G4730 Sleep apnea, unspecified: Secondary | ICD-10-CM | POA: Diagnosis not present

## 2015-12-06 DIAGNOSIS — F17200 Nicotine dependence, unspecified, uncomplicated: Secondary | ICD-10-CM | POA: Diagnosis not present

## 2015-12-06 DIAGNOSIS — F329 Major depressive disorder, single episode, unspecified: Secondary | ICD-10-CM | POA: Diagnosis not present

## 2015-12-06 HISTORY — DX: Sleep apnea, unspecified: G47.30

## 2015-12-06 HISTORY — PX: COLONOSCOPY WITH PROPOFOL: SHX5780

## 2015-12-06 LAB — GLUCOSE, CAPILLARY: GLUCOSE-CAPILLARY: 173 mg/dL — AB (ref 65–99)

## 2015-12-06 SURGERY — COLONOSCOPY WITH PROPOFOL
Anesthesia: General

## 2015-12-06 MED ORDER — PROPOFOL 10 MG/ML IV BOLUS
INTRAVENOUS | Status: DC | PRN
  Administered 2015-12-06: 50 mg via INTRAVENOUS

## 2015-12-06 MED ORDER — PROPOFOL 500 MG/50ML IV EMUL
INTRAVENOUS | Status: DC | PRN
  Administered 2015-12-06: 75 ug/kg/min via INTRAVENOUS

## 2015-12-06 MED ORDER — SODIUM CHLORIDE 0.9 % IV SOLN
INTRAVENOUS | Status: DC
  Administered 2015-12-06: 1000 mL via INTRAVENOUS

## 2015-12-06 MED ORDER — LACTATED RINGERS IV SOLN
INTRAVENOUS | Status: DC | PRN
  Administered 2015-12-06: 13:00:00 via INTRAVENOUS

## 2015-12-06 NOTE — Transfer of Care (Signed)
Immediate Anesthesia Transfer of Care Note  Patient: Ariel Johnston  Procedure(s) Performed: Procedure(s): COLONOSCOPY WITH PROPOFOL (N/A)  Patient Location: PACU  Anesthesia Type:General  Level of Consciousness: sedated  Airway & Oxygen Therapy: Patient Spontanous Breathing  Post-op Assessment: Report given to RN and Post -op Vital signs reviewed and stable  Post vital signs: Reviewed  Last Vitals:  Filed Vitals:   12/06/15 1039  BP: 141/85  Pulse: 108  Temp: 37 C  Resp: 20    Last Pain: There were no vitals filed for this visit.       Complications: No apparent anesthesia complications

## 2015-12-06 NOTE — H&P (Signed)
  Ariel Miniumarren Zorah Backes, [facility] [address] [place], [place] [address] Phone: [phone] Fax : [phone]  Primary Care Physician:  Dennison MascotLemont Morrisey, Ariel Johnston Primary Gastroenterologist:  Dr. Servando SnareWohl  Pre-Procedure History & Physical: HPI:  Ariel Johnston is a 55 y.o. female is here for a screening colonoscopy.   Past Medical History  Diagnosis Date  . GERD (gastroesophageal reflux disease)   . Depression   . Hypertension   . Hyperlipidemia   . Diabetes mellitus without complication (HCC)   . Sleep apnea     Past Surgical History  Procedure Laterality Date  . Dilation and curettage of uterus  2007    Prior to Admission medications   Medication Sig Start Date End Date Taking? Authorizing Provider  aspirin (ASPIRIN ADULT LOW DOSE) 81 MG EC tablet Take 81 mg by mouth daily.  04/07/14   Historical Provider, Ariel Johnston  atorvastatin (LIPITOR) 40 MG tablet TAKE 1 TABLET AT BEDTIME 09/30/15   Dennison MascotLemont Morrisey, Ariel Johnston  canagliflozin (INVOKANA) 100 MG TABS tablet Take 1 tablet (100 mg total) by mouth daily. 09/30/15   Dennison MascotLemont Morrisey, Ariel Johnston  glipiZIDE (GLUCOTROL XL) 10 MG 24 hr tablet Take 1 tablet (10 mg total) by mouth 2 (two) times daily. 1 bid 09/30/15   Dennison MascotLemont Morrisey, Ariel Johnston  losartan-hydrochlorothiazide (HYZAAR) 100-12.5 MG tablet Take 1 tablet by mouth daily. 09/30/15   Dennison MascotLemont Morrisey, Ariel Johnston  sitaGLIPtin-metformin (JANUMET) 50-1000 MG tablet Take 1 tablet by mouth 2 (two) times daily. 11/26/15   Kerman PasseyMelinda P Lada, Ariel Johnston    Allergies as of 10/25/2015 - Review Complete 04/14/2015  Allergen Reaction Noted  . Victoza [liraglutide] Rash 09/14/2014    Family History  Problem Relation Age of Onset  . Hypertension Brother     Social History   Social History  . Marital Status: Married    Spouse Name: N/A  . Number of Children: N/A  . Years of Education: N/A   Occupational History  . Not on file.   Social History Main Topics  . Smoking status: Current Every Day Smoker -- 0.50 packs/day  . Smokeless  tobacco: Not on file  . Alcohol Use: No  . Drug Use: No  . Sexual Activity: Yes   Other Topics Concern  . Not on file   Social History Narrative    Review of Systems: See HPI, otherwise negative ROS  Physical Exam: BP 141/85 mmHg  Pulse 108  Temp(Src) 98.6 F (37 C) (Tympanic)  Resp 20  Ht 5\' 1"  (1.549 m)  Wt 249 lb (112.946 kg)  BMI 47.07 kg/m2  SpO2 97% General:   Alert,  pleasant and cooperative in NAD Head:  Normocephalic and atraumatic. Neck:  Supple; no masses or thyromegaly. Lungs:  Clear throughout to auscultation.    Heart:  Regular rate and rhythm. Abdomen:  Soft, nontender and nondistended. Normal bowel sounds, without guarding, and without rebound.   Neurologic:  Alert and  oriented x4;  grossly normal neurologically.  Impression/Plan: Ariel ShanBridgette F Knoch is now here to undergo a screening colonoscopy.  Risks, benefits, and alternatives regarding colonoscopy have been reviewed with the patient.  Questions have been answered.  All parties agreeable.

## 2015-12-06 NOTE — Op Note (Signed)
[facility] Patient Name: Ariel Johnston Procedure Date: 12/06/2015 12:37 PM MRN: [id number] Account #: [id number] Date of Birth: 1960-12-29 Admit Type: Outpatient Age: 55 Room: [facility] ENDO ROOM 4 Gender: Female Note Status: Finalized Procedure:            Colonoscopy Indications:          Screening for colorectal malignant neoplasm Providers:            Ariel Minium, MD Referring MD:         Ariel Mascot, MD (Referring MD) Medicines:            Propofol per Anesthesia Complications:        No immediate complications. Procedure:            Pre-Anesthesia Assessment:                       - Prior to the procedure, a History and Physical was                        performed, and patient medications and allergies were                        reviewed. The patient's tolerance of previous                        anesthesia was also reviewed. The risks and benefits of                        the procedure and the sedation options and risks were                        discussed with the patient. All questions were                        answered, and informed consent was obtained. Prior                        Anticoagulants: The patient has taken no previous                        anticoagulant or antiplatelet agents. ASA Grade                        Assessment: II - A patient with mild systemic disease.                        After reviewing the risks and benefits, the patient was                        deemed in satisfactory condition to undergo the                        procedure.                       After obtaining informed consent, the colonoscope was                        passed under direct vision. Throughout the procedure,  the patient's blood pressure, pulse, and oxygen                        saturations were monitored continuously. The                        Colonoscope was introduced through the anus and             advanced to the the cecum, identified by appendiceal                        orifice and ileocecal valve. The colonoscopy was                        performed without difficulty. The patient tolerated the                        procedure well. The quality of the bowel preparation                        was excellent. Findings:      The perianal and digital rectal examinations were normal.      Non-bleeding internal hemorrhoids were found during retroflexion. The       hemorrhoids were Grade I (internal hemorrhoids that do not prolapse). Impression:           - Non-bleeding internal hemorrhoids.                       - No specimens collected. Recommendation:       - Repeat colonoscopy in 10 years for screening unless                        any change in family history or lower GI problems. Procedure Code(s):    --- Professional ---                       [phone], Colonoscopy, flexible; diagnostic, including                        collection of specimen(s) by brushing or washing, when                        performed (separate procedure) Diagnosis Code(s):    --- Professional ---                       Z12.11, Encounter for screening for malignant neoplasm                        of colon CPT copyright 2016 American Medical Association. All rights reserved. The codes documented in this report are preliminary and upon coder review may  be revised to meet current compliance requirements. Ariel Miniumarren Jessicah Croll, MD 12/06/2015 12:55:18 PM This report has been signed electronically. Number of Addenda: 0 Note Initiated On: 12/06/2015 12:37 PM Scope Withdrawal Time: 0 hours 6 minutes 29 seconds  Total Procedure Duration: 0 hours 9 minutes 26 seconds       [facility]

## 2015-12-06 NOTE — Anesthesia Preprocedure Evaluation (Signed)
Anesthesia Evaluation  Patient identified by MRN, date of birth, ID band Patient awake    Reviewed: Allergy & Precautions, NPO status , Patient's Chart, lab work & pertinent test results  Airway Mallampati: II       Dental  (+) Teeth Intact   Pulmonary sleep apnea , Current Smoker,    breath sounds clear to auscultation       Cardiovascular Exercise Tolerance: Good hypertension, Pt. on medications  Rhythm:Regular     Neuro/Psych    GI/Hepatic Neg liver ROS, GERD  Medicated,  Endo/Other  diabetes, Type 2, Oral Hypoglycemic AgentsMorbid obesity  Renal/GU      Musculoskeletal   Abdominal (+) + obese,   Peds negative pediatric ROS (+)  Hematology negative hematology ROS (+)   Anesthesia Other Findings   Reproductive/Obstetrics                             Anesthesia Physical Anesthesia Plan  ASA: III  Anesthesia Plan: General   Post-op Pain Management:    Induction: Intravenous  Airway Management Planned: Natural Airway and Nasal Cannula  Additional Equipment:   Intra-op Plan:   Post-operative Plan:   Informed Consent: I have reviewed the patients History and Physical, chart, labs and discussed the procedure including the risks, benefits and alternatives for the proposed anesthesia with the patient or authorized representative who has indicated his/her understanding and acceptance.     Plan Discussed with: CRNA  Anesthesia Plan Comments:         Anesthesia Quick Evaluation

## 2015-12-07 NOTE — Anesthesia Postprocedure Evaluation (Signed)
Anesthesia Post Note  Patient: Ariel ShanBridgette F Saylor  Procedure(s) Performed: Procedure(s) (LRB): COLONOSCOPY WITH PROPOFOL (N/A)  Patient location during evaluation: PACU Anesthesia Type: General Level of consciousness: awake Pain management: pain level controlled Vital Signs Assessment: post-procedure vital signs reviewed and stable Respiratory status: spontaneous breathing Cardiovascular status: blood pressure returned to baseline Anesthetic complications: no    Last Vitals:  Filed Vitals:   12/06/15 1322 12/06/15 1332  BP: 116/92 112/73  Pulse: 90 87  Temp:    Resp: 19 16    Last Pain: There were no vitals filed for this visit.               VAN STAVEREN,Araminta Zorn

## 2015-12-11 ENCOUNTER — Encounter: Payer: Self-pay | Admitting: Gastroenterology

## 2015-12-15 ENCOUNTER — Ambulatory Visit: Payer: BLUE CROSS/BLUE SHIELD | Admitting: Family Medicine

## 2016-01-17 ENCOUNTER — Encounter: Payer: Self-pay | Admitting: Family Medicine

## 2016-01-17 ENCOUNTER — Ambulatory Visit (INDEPENDENT_AMBULATORY_CARE_PROVIDER_SITE_OTHER): Payer: BLUE CROSS/BLUE SHIELD | Admitting: Family Medicine

## 2016-01-17 VITALS — BP 122/84 | HR 98 | Temp 98.6°F | Resp 14 | Wt 262.0 lb

## 2016-01-17 DIAGNOSIS — Z720 Tobacco use: Secondary | ICD-10-CM

## 2016-01-17 DIAGNOSIS — E785 Hyperlipidemia, unspecified: Secondary | ICD-10-CM | POA: Diagnosis not present

## 2016-01-17 DIAGNOSIS — R635 Abnormal weight gain: Secondary | ICD-10-CM

## 2016-01-17 DIAGNOSIS — Z5181 Encounter for therapeutic drug level monitoring: Secondary | ICD-10-CM | POA: Diagnosis not present

## 2016-01-17 DIAGNOSIS — E1165 Type 2 diabetes mellitus with hyperglycemia: Secondary | ICD-10-CM

## 2016-01-17 DIAGNOSIS — M654 Radial styloid tenosynovitis [de Quervain]: Secondary | ICD-10-CM | POA: Diagnosis not present

## 2016-01-17 LAB — COMPREHENSIVE METABOLIC PANEL
ALBUMIN: 3.7 g/dL (ref 3.6–5.1)
ALT: 34 U/L — ABNORMAL HIGH (ref 6–29)
AST: 24 U/L (ref 10–35)
Alkaline Phosphatase: 54 U/L (ref 33–130)
BUN: 13 mg/dL (ref 7–25)
CHLORIDE: 107 mmol/L (ref 98–110)
CO2: 21 mmol/L (ref 20–31)
CREATININE: 0.66 mg/dL (ref 0.50–1.05)
Calcium: 8.7 mg/dL (ref 8.6–10.4)
Glucose, Bld: 144 mg/dL — ABNORMAL HIGH (ref 65–99)
POTASSIUM: 4.3 mmol/L (ref 3.5–5.3)
SODIUM: 141 mmol/L (ref 135–146)
Total Bilirubin: 0.3 mg/dL (ref 0.2–1.2)
Total Protein: 7.1 g/dL (ref 6.1–8.1)

## 2016-01-17 LAB — LIPID PANEL
CHOL/HDL RATIO: 2.7 ratio (ref <=5.0)
Cholesterol: 142 mg/dL (ref 125–200)
HDL: 53 mg/dL (ref >=46)
LDL Cholesterol: 69 mg/dL (ref <130)
TRIGLYCERIDES: 98 mg/dL (ref <150)
VLDL: 20 mg/dL (ref <30)

## 2016-01-17 MED ORDER — ATORVASTATIN CALCIUM 40 MG PO TABS: 40.0000 mg | ORAL_TABLET | Freq: Every day | ORAL | 0 refills | Status: DC

## 2016-01-17 MED ORDER — LOSARTAN POTASSIUM-HCTZ 100-12.5 MG PO TABS: 1.0000 | ORAL_TABLET | Freq: Every day | ORAL | 0 refills | Status: DC

## 2016-01-17 MED ORDER — SITAGLIPTIN PHOS-METFORMIN HCL 50-1000 MG PO TABS: 1.0000 | ORAL_TABLET | Freq: Two times a day (BID) | ORAL | 0 refills | Status: DC

## 2016-01-17 NOTE — Assessment & Plan Note (Signed)
Check A1c and glucose; stop the sulfonylurea; consider increasing SGLT-2 inhibitor, and she may switch off Invokana due to recent press reports; urged weight loss

## 2016-01-17 NOTE — Patient Instructions (Addendum)
Try to limit saturated fats in your diet (bologna, hot dogs, barbeque, cheeseburgers, hamburgers, steak, bacon, sausage, cheese, etc.) and get more fresh fruits, vegetables, and whole grains  Check out the information at familydoctor.org entitled "Nutrition for Weight Loss: What You Need to Know about Fad Diets" Try to lose between 1-2 pounds per week by taking in fewer calories and burning off more calories You can succeed by limiting portions, limiting foods dense in calories and fat, becoming more active, and drinking 8 glasses of water a day (64 ounces) Don't skip meals, especially breakfast, as skipping meals may alter your metabolism Do not use over-the-counter weight loss pills or gimmicks that claim rapid weight loss A healthy BMI (or body mass index) is between 18.5 and 24.9 You can calculate your ideal BMI at the NIH website [employer]  Stop the glipizide We may switch the Invokana to either [place] or Farxiga, just let me know which one your insurance will cover  I do encourage you to quit smoking Call 6786189621 to sign up for smoking cessation classes You can call 1-800-QUIT-NOW to talk with a smoking cessation coach  Read about wellbutrin for smoking cessation and cravings   Try turmeric as a natural anti-inflammatory (for pain and arthritis). It comes in capsules where you buy aspirin and fish oil, but also as a spice where you buy pepper and garlic powder. Try ice topically 20 minutes at a time, 3-4 times a day, cloth between ice and skin  High Cholesterol High cholesterol refers to having a high level of cholesterol in your blood. Cholesterol is a white, waxy, fat-like protein that your body needs in small amounts. Your liver makes all the cholesterol you need. Excess cholesterol comes from the food you eat. Cholesterol travels in your bloodstream through your blood vessels. If you have high cholesterol, deposits  (plaque) may build up on the walls of your blood vessels. This makes the arteries narrower and stiffer. Plaque increases your risk of heart attack and stroke. Work with your health care provider to keep your cholesterol levels in a healthy range. RISK FACTORS Several things can make you more likely to have high cholesterol. These include:   Eating foods high in animal fat (saturated fat) or cholesterol.  Being overweight.  Not getting enough exercise.  Having a family history of high cholesterol. SIGNS AND SYMPTOMS High cholesterol does not cause symptoms. DIAGNOSIS  Your health care provider can do a blood test to check whether you have high cholesterol. If you are older than 20, your health care provider may check your cholesterol every 4-6 years. You may be checked more often if you already have high cholesterol or other risk factors for heart disease. The blood test for cholesterol measures the following:  Bad cholesterol (LDL cholesterol). This is the type of cholesterol that causes heart disease. This number should be less than 100.  Good cholesterol (HDL cholesterol). This type helps protect against heart disease. A healthy level of HDL cholesterol is 60 or higher.  Total cholesterol. This is the combined number of LDL cholesterol and HDL cholesterol. A healthy number is less than 200. TREATMENT  High cholesterol can be treated with diet changes, lifestyle changes, and medicine.   Diet changes may include eating more whole grains, fruits, vegetables, nuts, and fish. You may also have to cut back on red meat and foods with a lot of added sugar.  Lifestyle changes may include getting at least 40 minutes of aerobic exercise three times a  week. Aerobic exercises include walking, biking, and swimming. Aerobic exercise along with a healthy diet can help you maintain a healthy weight. Lifestyle changes may also include quitting smoking.  If diet and lifestyle changes are not enough to  lower your cholesterol, your health care provider may prescribe a statin medicine. This medicine has been shown to lower cholesterol and also lower the risk of heart disease. HOME CARE INSTRUCTIONS  Only take over-the-counter or prescription medicines as directed by your health care provider.   Follow a healthy diet as directed by your health care provider. For instance:   Eat chicken (without skin), fish, veal, shellfish, ground [place]urkey breast, and round or loin cuts of red meat.  Do not eat fried foods and fatty meats, such as hot dogs and salami.   Eat plenty of fruits, such as apples.   Eat plenty of vegetables, such as broccoli, potatoes, and carrots.   Eat beans, peas, and lentils.   Eat grains, such as barley, rice, couscous, and bulgur wheat.   Eat pasta without cream sauces.   Use skim or nonfat milk and low-fat or nonfat yogurt and cheeses. Do not eat or drink whole milk, cream, ice cream, egg yolks, and hard cheeses.   Do not eat stick margarine or tub margarines that contain trans fats (also called partially hydrogenated oils).   Do not eat cakes, cookies, crackers, or other baked goods that contain trans fats.   Do not eat saturated tropical oils, such as coconut and palm oil.   Exercise as directed by your health care provider. Increase your activity level with activities such as gardening or walking.   Keep all follow-up appointments.  SEEK MEDICAL CARE IF:  You are struggling to maintain a healthy diet or weight.  You need help starting an exercise program.  You need help to stop smoking. SEEK IMMEDIATE MEDICAL CARE IF:  You have chest pain.  You have trouble breathing.   This information is not intended to replace advice given to you by your health care provider. Make sure you discuss any questions you have with your health care provider.   Document Released: 05/14/2005 Document Revised: 06/04/2014 Document Reviewed: 03/06/2013 Elsevier  Interactive Patient Education 2016 [employer] Smoking Cessation, Tips for Success If you are ready to quit smoking, congratulations! You have chosen to help yourself be healthier. Cigarettes bring nicotine, tar, carbon monoxide, and other irritants into your body. Your lungs, heart, and blood vessels will be able to work better without these poisons. There are many different ways to quit smoking. Nicotine gum, nicotine patches, a nicotine inhaler, or nicotine nasal spray can help with physical craving. Hypnosis, support groups, and medicines help break the habit of smoking. WHAT THINGS CAN I DO TO MAKE QUITTING EASIER?  Here are some tips to help you quit for good:  Pick a date when you will quit smoking completely. Tell all of your friends and family about your plan to quit on that date.  Do not try to slowly cut down on the number of cigarettes you are smoking. Pick a quit date and quit smoking completely starting on that day.  Throw away all cigarettes.   Clean and remove all ashtrays from your home, work, and car.  On a card, write down your reasons for quitting. Carry the card with you and read it when you get the urge to smoke.  Cleanse your body of nicotine. Drink enough water and fluids to keep your urine clear or pale  yellow. Do this after quitting to flush the nicotine from your body.  Learn to predict your moods. Do not let a bad situation be your excuse to have a cigarette. Some situations in your life might tempt you into wanting a cigarette.  Never have "just one" cigarette. It leads to wanting another and another. Remind yourself of your decision to quit.  Change habits associated with smoking. If you smoked while driving or when feeling stressed, try other activities to replace smoking. Stand up when drinking your coffee. Brush your teeth after eating. Sit in a different chair when you read the paper. Avoid alcohol while trying to quit, and try to drink fewer caffeinated  beverages. Alcohol and caffeine may urge you to smoke.  Avoid foods and drinks that can trigger a desire to smoke, such as sugary or spicy foods and alcohol.  Ask people who smoke not to smoke around you.  Have something planned to do right after eating or having a cup of coffee. For example, plan to take a walk or exercise.  Try a relaxation exercise to calm you down and decrease your stress. Remember, you may be tense and nervous for the first 2 weeks after you quit, but this will pass.  Find new activities to keep your hands busy. Play with a pen, coin, or rubber band. Doodle or draw things on paper.  Brush your teeth right after eating. This will help cut down on the craving for the taste of tobacco after meals. You can also try mouthwash.   Use oral substitutes in place of cigarettes. Try using lemon drops, carrots, cinnamon sticks, or chewing gum. Keep them handy so they are available when you have the urge to smoke.  When you have the urge to smoke, try deep breathing.  Designate your home as a nonsmoking area.  If you are a heavy smoker, ask your health care provider about a prescription for nicotine chewing gum. It can ease your withdrawal from nicotine.  Reward yourself. Set aside the cigarette money you save and buy yourself something nice.  Look for support from others. Join a support group or smoking cessation program. Ask someone at home or at work to help you with your plan to quit smoking.  Always ask yourself, "Do I need this cigarette or is this just a reflex?" Tell yourself, "Today, I choose not to smoke," or "I do not want to smoke." You are reminding yourself of your decision to quit.  Do not replace cigarette smoking with electronic cigarettes (commonly called e-cigarettes). The safety of e-cigarettes is unknown, and some may contain harmful chemicals.  If you relapse, do not give up! Plan ahead and think about what you will do the next time you get the urge to  smoke. HOW WILL I FEEL WHEN I QUIT SMOKING? You may have symptoms of withdrawal because your body is used to nicotine (the addictive substance in cigarettes). You may crave cigarettes, be irritable, feel very hungry, cough often, get headaches, or have difficulty concentrating. The withdrawal symptoms are only temporary. They are strongest when you first quit but will go away within 10-14 days. When withdrawal symptoms occur, stay in control. Think about your reasons for quitting. Remind yourself that these are signs that your body is healing and getting used to being without cigarettes. Remember that withdrawal symptoms are easier to treat than the major diseases that smoking can cause.  Even after the withdrawal is over, expect periodic urges to smoke. However, these  cravings are generally short lived and will go away whether you smoke or not. Do not smoke! WHAT RESOURCES ARE AVAILABLE TO HELP ME QUIT SMOKING? Your health care provider can direct you to community resources or hospitals for support, which may include:  Group support.  Education.  Hypnosis.  Therapy.   This information is not intended to replace advice given to you by your health care provider. Make sure you discuss any questions you have with your health care provider.   Document Released: 02/10/2004 Document Revised: 06/04/2014 Document Reviewed: 10/30/2012 Elsevier Interactive Patient Education [employer]

## 2016-01-17 NOTE — Progress Notes (Signed)
BP 122/84   Pulse 98   Temp 98.6 F (37 C) (Oral)   Resp 14   Wt 262 lb (118.8 kg)   SpO2 97%   BMI 49.50 kg/m    Subjective:    Patient ID: Ariel Johnston, female    DOB: 03/01/1961, 55 y.o.   MRN: [id number]  HPI: Ariel Johnston is a 55 y.o. female  Chief Complaint  Patient presents with  . Medication Refill  . Wrist Pain    left  . Obesity    wanst weight loss med   Patient is new to me; his usual provider is out of the office for an extended time  Type 2 diabetes; just about the same; no dry mouth and no blurrred vision; will have eye exam soon, goes yearly; tries to limit white bread and potatoes, drinks diet sodas; not checking sugars; no numbness or sores in the feet Reviewed last A1c, 8.5 in November 2016  High cholesterol; on medicine; no muscle aches; not limiting saturated fats  She is obese; she tried [employer]ages[employer]kins; she eats all the wrong food; she needs something to curb her appetite; she has never eaten this much better; she is eating all day; she has a fit bit and is increasing her activity  Tobacco abuse, 1/3 to 1/2 ppd  [facility]/17/2016 04/14/2015  Decreased Interest 0 0  Down, Depressed, Hopeless 1 0  PHQ - 2 Score 1 0   Relevant past medical, surgical, family and social history reviewed Past Medical History:  Diagnosis Date  . Depression   . Diabetes mellitus without complication (HCC)   . GERD (gastroesophageal reflux disease)   . Hyperlipidemia   . Hypertension   . Sleep apnea    Past Surgical History:  Procedure Laterality Date  . COLONOSCOPY WITH PROPOFOL N/A 12/06/2015   Procedure: COLONOSCOPY WITH PROPOFOL;  Surgeon: Midge Miniumarren Wohl, MD;  Location: [facility] ENDOSCOPY;  Service: Endoscopy;  Laterality: N/A;  . DILATION AND CURETTAGE OF UTERUS  2007   Family History  Problem Relation Age of Onset  . Hypertension Brother    Social History  Substance Use Topics  . Smoking status: Current Every Day Smoker   Packs/day: 0.50  . Smokeless tobacco: Not on file  . Alcohol use No   Interim medical history since last visit reviewed. Allergies and medications reviewed  Review of Systems Per HPI unless specifically indicated above     Objective:    BP 122/84   Pulse 98   Temp 98.6 F (37 C) (Oral)   Resp 14   Wt 262 lb (118.8 kg)   SpO2 97%   BMI 49.50 kg/m   Wt Readings from Last 3 Encounters:  01/17/16 262 lb (118.8 kg)  12/06/15 249 lb (112.9 kg)  04/14/15 255 lb (115.7 kg)    Physical Exam  Constitutional: She appears well-developed and well-nourished. No distress.  Morbidly obese  HENT:  Head: Normocephalic and atraumatic.  Eyes: EOM are normal. No scleral icterus.  Neck: No thyromegaly present.  Cardiovascular: Normal rate, regular rhythm and normal heart sounds.   No murmur heard. Pulmonary/Chest: Effort normal and breath sounds normal. No respiratory distress. She has no wheezes.  Abdominal: Soft. Bowel sounds are normal. She exhibits no distension.  Musculoskeletal: She exhibits no edema.       Left wrist: She exhibits decreased range of motion and tenderness. She exhibits no swelling, no crepitus and no deformity.  Neurological: She is alert. She exhibits  normal muscle tone.  Skin: Skin is warm and dry. She is not diaphoretic. No pallor.  Psychiatric: She has a normal mood and affect. Her behavior is normal. Judgment and thought content normal.   Diabetic Foot Form - Detailed   Diabetic Foot Exam - detailed Diabetic Foot exam was performed with the following findings:  Yes 01/17/2016  9:22 PM  Visual Foot Exam completed.:  Yes  Are the toenails ingrown?:  No Normal Range of Motion:  Yes Pulse Foot Exam completed.:  Yes  Right Dorsalis Pedis:  Present Left Dorsalis Pedis:  Present  Sensory Foot Exam Completed.:  Yes Swelling:  No Semmes-Weinstein Monofilament Test R Site 1-Great Toe:  Pos L Site 1-Great Toe:  Pos  R Site 4:  Pos L Site 4:  Pos  R Site 5:  Pos L  Site 5:  Pos          Assessment & Plan:   Problem List Items Addressed This Visit      Endocrine   Diabetes mellitus, type 2 (HCC) - Primary    Check A1c and glucose; stop the sulfonylurea; consider increasing SGLT-2 inhibitor, and she may switch off Invokana due to recent press reports; urged weight loss      Relevant Medications   atorvastatin (LIPITOR) 40 MG tablet   losartan-hydrochlorothiazide (HYZAAR) 100-12.5 MG tablet   sitaGLIPtin-metformin (JANUMET) 50-1000 MG tablet   Other Relevant Orders   Hemoglobin A1c (Completed)   Microalbumin / creatinine urine ratio (Completed)     Other   Tobacco abuse    See AVS      Medication monitoring encounter    Check sgpt      Relevant Orders   Comprehensive metabolic panel (Completed)   Hyperlipidemia    Well-controlled on statin; limit saturated fats and check lipids today      Relevant Medications   atorvastatin (LIPITOR) 40 MG tablet   losartan-hydrochlorothiazide (HYZAAR) 100-12.5 MG tablet   Other Relevant Orders   Lipid panel (Completed)   Abnormal weight gain    Check TSH      Relevant Orders   TSH (Completed)    Other Visit Diagnoses    De Quervain's tenosynovitis, left       explained diagnosis; see AVS      Follow up plan: Return in about 3 months (around 04/18/2016) for diabetes and other issues.  An after-visit summary was printed and given to the patient at check-out.  Please see the patient instructions which may contain other information and recommendations beyond what is mentioned above in the assessment and plan.  Meds ordered this encounter  Medications  . atorvastatin (LIPITOR) 40 MG tablet    Sig: Take 1 tablet (40 mg total) by mouth at bedtime.    Dispense:  90 tablet    Refill:  0  . losartan-hydrochlorothiazide (HYZAAR) 100-12.5 MG tablet    Sig: Take 1 tablet by mouth daily.    Dispense:  90 tablet    Refill:  0  . sitaGLIPtin-metformin (JANUMET) 50-1000 MG tablet    Sig: Take  1 tablet by mouth 2 (two) times daily.    Dispense:  180 tablet    Refill:  0    Orders Placed This Encounter  Procedures  . TSH  . Hemoglobin A1c  . Lipid panel  . Comprehensive metabolic panel  . Microalbumin / creatinine urine ratio

## 2016-01-17 NOTE — Assessment & Plan Note (Signed)
Check sgpt 

## 2016-01-17 NOTE — Assessment & Plan Note (Signed)
See AVS

## 2016-01-17 NOTE — Assessment & Plan Note (Signed)
Well-controlled on statin; limit saturated fats and check lipids today

## 2016-01-17 NOTE — Assessment & Plan Note (Signed)
Check TSH 

## 2016-01-18 LAB — HEMOGLOBIN A1C
Hgb A1c MFr Bld: 8.3 % — ABNORMAL HIGH (ref <5.7)
MEAN PLASMA GLUCOSE: 192 mg/dL

## 2016-01-18 LAB — MICROALBUMIN / CREATININE URINE RATIO
CREATININE, URINE: 78 mg/dL (ref 20–320)
MICROALB UR: 0.8 mg/dL
MICROALB/CREAT RATIO: 10 ug/mg{creat} (ref <30)

## 2016-01-18 LAB — TSH: TSH: 0.91 mIU/L

## 2016-01-19 ENCOUNTER — Telehealth: Payer: Self-pay | Admitting: Family Medicine

## 2016-01-19 ENCOUNTER — Encounter: Payer: Self-pay | Admitting: Family Medicine

## 2016-01-19 MED ORDER — EMPAGLIFLOZIN 10 MG PO TABS: 10.0000 mg | ORAL_TABLET | Freq: Every day | ORAL | 1 refills | Status: DC

## 2016-01-19 MED ORDER — BUPROPION HCL ER (XL) 150 MG PO TB24: 150.0000 mg | ORAL_TABLET | Freq: Every day | ORAL | 1 refills | Status: DC

## 2016-01-20 NOTE — Telephone Encounter (Signed)
I sent Rxs yesterday

## 2016-03-16 ENCOUNTER — Encounter: Payer: Self-pay | Admitting: Family Medicine

## 2016-03-16 DIAGNOSIS — R079 Chest pain, unspecified: Secondary | ICD-10-CM

## 2016-03-19 ENCOUNTER — Other Ambulatory Visit: Payer: Self-pay

## 2016-03-19 ENCOUNTER — Telehealth: Payer: Self-pay

## 2016-03-19 MED ORDER — GLUCOSE BLOOD VI STRP: ORAL_STRIP | 3 refills | Status: DC

## 2016-03-20 DIAGNOSIS — R079 Chest pain, unspecified: Secondary | ICD-10-CM | POA: Insufficient documentation

## 2016-03-20 NOTE — Addendum Note (Signed)
Addended by: LADA, Janit BernMELINDA P on: 03/20/2016 05:45 PM   Modules accepted: Orders

## 2016-03-20 NOTE — Assessment & Plan Note (Signed)
Refer to cardiology; see MyChart note

## 2016-03-23 NOTE — Telephone Encounter (Signed)
-----   Message from Christell Faithraci L Tekely sent at 03/22/2016  9:18 AM EDT ----- Regarding: Cardiology referral Patient states that she is fine and [facility]

## 2016-03-28 ENCOUNTER — Ambulatory Visit (INDEPENDENT_AMBULATORY_CARE_PROVIDER_SITE_OTHER): Payer: BLUE CROSS/BLUE SHIELD | Admitting: Internal Medicine

## 2016-03-28 ENCOUNTER — Encounter: Payer: Self-pay | Admitting: Internal Medicine

## 2016-03-28 VITALS — BP 120/78 | HR 95 | Ht 61.0 in | Wt 256.5 lb

## 2016-03-28 DIAGNOSIS — R079 Chest pain, unspecified: Secondary | ICD-10-CM

## 2016-03-28 DIAGNOSIS — I10 Essential (primary) hypertension: Secondary | ICD-10-CM | POA: Diagnosis not present

## 2016-03-28 DIAGNOSIS — R002 Palpitations: Secondary | ICD-10-CM | POA: Diagnosis not present

## 2016-03-28 NOTE — Progress Notes (Signed)
New Outpatient Visit Date: 03/28/2016  Referring Provider: Baruch GoutyMelinda Lada, MD  Chief Complaint: Palpitations  HPI:  [name] is a 55 y.o. year-old female with history of diabetes mellitus, hypertension, hyperlipidemia, and sleep apnea, who has been referred by Dr. Sherie DonLada for evaluation of palpitations. The patient had 2 episodes of palpitations that she describes as forceful beating of her heart. She also had some tenderness of her chest wall for several days thereafter. Both episodes began shortly after she took 2 new medications, Jardiance and Wellbutrin beginning in late August. Her heart did not seem to be beating particularly fast however. Both episodes gradually resolved over the course of 2 hours. She has not had any recurrences since stopping these medications. She otherwise has not had any episodes of chest pain, shortness of breath, palpitations, lightheadedness, orthopnea, PND, or edema. The patient does not have a history of any cardiovascular disease. She has not undergone previous workup. She consumes 60-70 ounces of caffeinated soda a day.  The patient does not wear CPAP on a nightly basis, using it about 3 nights per week. She notes that she feels better on days after having worn CPAP the night before.  --------------------------------------------------------------------------------------------------  Cardiovascular History & Procedures: Cardiovascular Problems:  Palpitations  Risk Factors:  Hypertension, hyperlipidemia, diabetes mellitus, and obesity  Cath/PCI:  None  CV Surgery:  None  EP Procedures and Devices:  None  Non-Invasive Evaluation(s):  None  Recent CV Pertinent Labs: Lab Results  Component Value Date   CHOL 142 01/17/2016   CHOL 114 02/03/2015   HDL 53 01/17/2016   HDL 43 02/03/2015   LDLCALC 69 01/17/2016   LDLCALC 50 02/03/2015   TRIG 98 01/17/2016   CHOLHDL 2.7 01/17/2016   K 4.3 01/17/2016   BUN 13 01/17/2016   BUN 14 02/03/2015    CREATININE 0.66 01/17/2016   --------------------------------------------------------------------------------------------------  Past Medical History:  Diagnosis Date  . Depression   . Diabetes mellitus without complication (HCC)   . GERD (gastroesophageal reflux disease)   . Hyperlipidemia   . Hypertension   . Sleep apnea     Past Surgical History:  Procedure Laterality Date  . COLONOSCOPY WITH PROPOFOL N/A 12/06/2015   Procedure: COLONOSCOPY WITH PROPOFOL;  Surgeon: Midge Miniumarren Wohl, MD;  Location: [facility] ENDOSCOPY;  Service: Endoscopy;  Laterality: N/A;  . DILATION AND CURETTAGE OF UTERUS  2007    Outpatient Encounter Prescriptions as of 03/28/2016  Medication Sig  . aspirin (ASPIRIN ADULT LOW DOSE) 81 MG EC tablet Take 81 mg by mouth daily.   Marland Kitchen. atorvastatin (LIPITOR) 40 MG tablet Take 1 tablet (40 mg total) by mouth at bedtime.  . canagliflozin (INVOKANA) 100 MG TABS tablet Take 100 mg by mouth daily before breakfast.  . glucose blood (ONE TOUCH ULTRA TEST) test strip Use as instructed, check FSBS once a day; Dx E11.65, LON 99 months  . losartan-hydrochlorothiazide (HYZAAR) 100-12.5 MG tablet Take 1 tablet by mouth daily.  . sitaGLIPtin-metformin (JANUMET) 50-1000 MG tablet Take 1 tablet by mouth 2 (two) times daily. (Patient taking differently: Take 1 tablet by mouth daily. )   No facility-administered encounter medications on file as of 03/28/2016.     Allergies: Victoza [liraglutide]  Social History   Social History  . Marital status: Married    Spouse name: N/A  . Number of children: N/A  . Years of education: N/A   Occupational History  . Not on file.   Social History Main Topics  . Smoking status: Current Every  Day Smoker    Packs/day: 0.50    Years: 35.00    Types: Cigarettes  . Smokeless tobacco: Never Used  . Alcohol use No  . Drug use: No  . Sexual activity: Yes   Other Topics Concern  . Not on file   Social History Narrative  . No narrative on file      Family History  Problem Relation Age of Onset  . Hypertension Brother   . Stroke Father     Review of Systems: A 12-system review of systems was performed and was negative except as noted in the HPI.  --------------------------------------------------------------------------------------------------  Physical Exam: BP 120/78 (BP Location: Left Arm, Patient Position: Sitting, Cuff Size: Normal)   Pulse 95   Ht 5\' 1"  (1.549 m)   Wt 256 lb 8 oz (116.3 kg)   BMI 48.47 kg/m   General:  Morbidly obese woman, seated comfortably on the exam table. HEENT: No conjunctival pallor or scleral icterus.  Moist mucous membranes.  OP clear. Neck: Supple without lymphadenopathy, thyromegaly, JVD, or HJR, though evaluation is limited by body habitus.  No carotid bruit. Lungs: Normal work of breathing.  Clear to auscultation bilaterally without wheezes or crackles. Heart: Regular rate and rhythm without murmurs, rubs, or gallops.  Unable to assess PMI due to body habitus. Abd: Bowel sounds present.  Soft, NT/ND without hepatosplenomegaly Ext: No lower extremity edema.  Radial, PT, and DP pulses are 2+ bilaterally Skin: warm and dry without rash Neuro: CNIII-XII intact.  Strength and fine-touch sensation intact in upper and lower extremities bilaterally. Psych: Normal mood and affect.  EKG:  Normal sinus rhythm (heart rate upper normal at 95 bpm) without significant abnormalities.  Lab Results  Component Value Date   NA 141 01/17/2016   K 4.3 01/17/2016   CL 107 01/17/2016   CO2 21 01/17/2016   BUN 13 01/17/2016   CREATININE 0.66 01/17/2016   GLUCOSE 144 (H) 01/17/2016   ALT 34 (H) 01/17/2016    Lab Results  Component Value Date   CHOL 142 01/17/2016   HDL 53 01/17/2016   LDLCALC 69 01/17/2016   TRIG 98 01/17/2016   CHOLHDL 2.7 01/17/2016   Lab Results  Component Value Date   TSH 0.91 01/17/2016     --------------------------------------------------------------------------------------------------  ASSESSMENT AND PLAN: Palpitations [name] experienced two episodes of palpitations surrounding initiation of Jardiance and bupropion in August. Both episodes were self limited and have not recurred since discontinuation of these medications. I suspect that her symptoms were a reaction to one of these agents, most likely bupropion. As she has not had a recurrence, we have agreed to defer any further evaluation. If the symptoms return, she may benefit from echocardiogram and cardiac event monitor. I have encouraged her to limit her caffeine consumption, as this can certainly contribute to palpitations as well.  Atypical chest pain The patient reported some soreness around the time of her palpitations, including tenderness of her chest palpation. She is not had any exertional symptoms including chest pain or shortness of breath. She has multiple cardiac risk factors but appears to be on a good regimen for primary prevention. Her most recent LDL in August was quite good at 70. I have encouraged her to stop smoking, as this could cut her 10 year atherosclerotic cardiovascular disease risk by almost half. We discussed the benefits of nicotine replacement and/or Chantix, which she wishes to review with Dr. Sherie Don.  Hypertension Blood pressure is well-controlled today.  We will not make  any medication changes.  Follow-up: Return to clinic as needed.  Yvonne Kendallhristopher Daylee Delahoz, MD 03/30/2016 8:13 AM

## 2016-03-28 NOTE — Patient Instructions (Signed)
Medication Instructions:  Your physician recommends that you continue on your current medications as directed. Please refer to the Current Medication list given to you today.  Labwork: NONE  Testing/Procedures: NONE  Follow-Up: Your physician recommends that you schedule a follow-up appointment as needed with Dr. Okey DupreEnd.   If you need a refill on your cardiac medications before your next appointment, please call your pharmacy.

## 2016-04-17 ENCOUNTER — Encounter: Payer: Self-pay | Admitting: Family Medicine

## 2016-04-17 ENCOUNTER — Ambulatory Visit (INDEPENDENT_AMBULATORY_CARE_PROVIDER_SITE_OTHER): Payer: BLUE CROSS/BLUE SHIELD | Admitting: Family Medicine

## 2016-04-17 DIAGNOSIS — E782 Mixed hyperlipidemia: Secondary | ICD-10-CM

## 2016-04-17 DIAGNOSIS — R1310 Dysphagia, unspecified: Secondary | ICD-10-CM

## 2016-04-17 DIAGNOSIS — I10 Essential (primary) hypertension: Secondary | ICD-10-CM | POA: Diagnosis not present

## 2016-04-17 DIAGNOSIS — R5383 Other fatigue: Secondary | ICD-10-CM | POA: Diagnosis not present

## 2016-04-17 DIAGNOSIS — K219 Gastro-esophageal reflux disease without esophagitis: Secondary | ICD-10-CM

## 2016-04-17 DIAGNOSIS — E1165 Type 2 diabetes mellitus with hyperglycemia: Secondary | ICD-10-CM

## 2016-04-17 DIAGNOSIS — Z720 Tobacco use: Secondary | ICD-10-CM

## 2016-04-17 DIAGNOSIS — Z5181 Encounter for therapeutic drug level monitoring: Secondary | ICD-10-CM

## 2016-04-17 DIAGNOSIS — E668 Other obesity: Secondary | ICD-10-CM

## 2016-04-17 DIAGNOSIS — R1319 Other dysphagia: Secondary | ICD-10-CM

## 2016-04-17 LAB — COMPLETE METABOLIC PANEL WITH GFR
ALBUMIN: 4.4 g/dL (ref 3.6–5.1)
ALK PHOS: 60 U/L (ref 33–130)
ALT: 36 U/L — ABNORMAL HIGH (ref 6–29)
AST: 27 U/L (ref 10–35)
BUN: 10 mg/dL (ref 7–25)
CO2: 24 mmol/L (ref 20–31)
Calcium: 9.6 mg/dL (ref 8.6–10.4)
Chloride: 102 mmol/L (ref 98–110)
Creat: 0.74 mg/dL (ref 0.50–1.05)
GLUCOSE: 203 mg/dL — AB (ref 65–99)
POTASSIUM: 4.3 mmol/L (ref 3.5–5.3)
SODIUM: 140 mmol/L (ref 135–146)
Total Bilirubin: 0.4 mg/dL (ref 0.2–1.2)
Total Protein: 7.9 g/dL (ref 6.1–8.1)

## 2016-04-17 LAB — CBC WITH DIFFERENTIAL/PLATELET
BASOS PCT: 0 %
Basophils Absolute: 0 cells/uL (ref 0–200)
EOS ABS: 114 {cells}/uL (ref 15–500)
Eosinophils Relative: 1 %
HEMATOCRIT: 47 % — AB (ref 35.0–45.0)
Hemoglobin: 15.2 g/dL (ref 11.7–15.5)
LYMPHS ABS: 2736 {cells}/uL (ref 850–3900)
Lymphocytes Relative: 24 %
MCH: 28.1 pg (ref 27.0–33.0)
MCHC: 32.3 g/dL (ref 32.0–36.0)
MCV: 86.9 fL (ref 80.0–100.0)
MONO ABS: 1140 {cells}/uL — AB (ref 200–950)
MPV: 10.8 fL (ref 7.5–12.5)
Monocytes Relative: 10 %
NEUTROS ABS: 7410 {cells}/uL (ref 1500–7800)
Neutrophils Relative %: 65 %
PLATELETS: 231 10*3/uL (ref 140–400)
RBC: 5.41 MIL/uL — ABNORMAL HIGH (ref 3.80–5.10)
RDW: 15.4 % — ABNORMAL HIGH (ref 11.0–15.0)
WBC: 11.4 10*3/uL — ABNORMAL HIGH (ref 3.8–10.8)

## 2016-04-17 LAB — LIPID PANEL
CHOLESTEROL: 136 mg/dL (ref <200)
HDL: 50 mg/dL — ABNORMAL LOW (ref >50)
LDL Cholesterol: 70 mg/dL (ref <100)
Total CHOL/HDL Ratio: 2.7 Ratio (ref <5.0)
Triglycerides: 82 mg/dL (ref <150)
VLDL: 16 mg/dL (ref <30)

## 2016-04-17 LAB — HEMOGLOBIN A1C
Hgb A1c MFr Bld: 9.9 % — ABNORMAL HIGH (ref <5.7)
Mean Plasma Glucose: 237 mg/dL

## 2016-04-17 LAB — TSH: TSH: 1.11 m[IU]/L

## 2016-04-17 MED ORDER — OMEPRAZOLE 20 MG PO CPDR: 20.0000 mg | DELAYED_RELEASE_CAPSULE | Freq: Every day | ORAL | 0 refills | Status: DC

## 2016-04-17 NOTE — Assessment & Plan Note (Signed)
Worse since chemical exposure; start omeprazole and refer to GI

## 2016-04-17 NOTE — Progress Notes (Signed)
BP 110/60 (BP Location: Left Arm, Patient Position: Sitting, Cuff Size: Normal)   Pulse 96   Temp 97.9 F (36.6 C) (Oral)   Resp 14   Ht 5\' 1"  (1.549 m)   Wt 255 lb 7 oz (115.9 kg)   SpO2 94%   BMI 48.26 kg/m    Subjective:    Patient ID: Ariel Johnston, female    DOB: [birth year], 55 y.o.   MRN: [id number]  HPI: Ariel Johnston is a 55 y.o. female  Chief Complaint  Patient presents with  . Diabetes  . Medication Problem    medication mangement  . Fatigue    Patinet inhale chemicals two weeks ago; burning in throat, nose, chest and abdomen    Patient sprayed some chemical in her bathroom (bathtub cleaner); I asked if she mixed it with anything; she thinks she had some ammonia in the kitchen, five rooms away but the two chemicals didn't contact each other; bathroom close to the bedroom; she could smell the odor afterwards; not burning later; then went back in and wiped it down; but after that for two weeks, she still feels it down in her airway; when she eats sometimes, it feels like food doesn't go down properly; not doing what it needs to do; her stomach seems hard up at the top; no blood in the stools; appetite has been okay; no wheezing; occasional cough, not constant, just dry, not productive; major heartburn, "major" since this event; getting worse; has tried Tums and that helps  Type 2 diabetes; got the strips and checking her numbers; "out of this world" bad; more conscious of what they are; trying to do a lot better with diet and exercise; just started in the last few weeks; no problems with feet  Obesity; one more pound down; not drinking enough water  HTN; well-controlled  Hyperlipidemia; loves fatty meats; eats some cheese added to hot dogs and hamburgers; 7 eggs a week; doing a lot of fast food  Depression screen [facility] 2/9 04/17/2016 01/17/2016 04/14/2015  Decreased Interest 0 0 0  Down, Depressed, Hopeless 0 1 0  PHQ - 2 Score 0 1 0   Relevant past  medical, surgical, family and social history reviewed Past Medical History:  Diagnosis Date  . Depression   . Diabetes mellitus without complication (HCC)   . GERD (gastroesophageal reflux disease)   . Hyperlipidemia   . Hypertension   . Sleep apnea    Past Surgical History:  Procedure Laterality Date  . COLONOSCOPY WITH PROPOFOL N/A 12/06/2015   Procedure: COLONOSCOPY WITH PROPOFOL;  Surgeon: Midge Minium, MD;  Location: [facility] ENDOSCOPY;  Service: Endoscopy;  Laterality: N/A;  . DILATION AND CURETTAGE OF UTERUS  2007   Family History  Problem Relation Age of Onset  . Hypertension Brother   . Stroke Father    Social History  Substance Use Topics  . Smoking status: Current Every Day Smoker    Packs/day: 0.50    Years: 35.00    Types: Cigarettes  . Smokeless tobacco: Never Used  . Alcohol use No  MD note: cannot tolerate wellbutrin  Interim medical history since last visit reviewed. Allergies and medications reviewed  Review of Systems Per HPI unless specifically indicated above     Objective:    BP 110/60 (BP Location: Left Arm, Patient Position: Sitting, Cuff Size: Normal)   Pulse 96   Temp 97.9 F (36.6 C) (Oral)   Resp 14   Ht 5\' 1"  (1.549 m)  Wt 255 lb 7 oz (115.9 kg)   SpO2 94%   BMI 48.26 kg/m   Wt Readings from Last 3 Encounters:  04/17/16 255 lb 7 oz (115.9 kg)  03/28/16 256 lb 8 oz (116.3 kg)  01/17/16 262 lb (118.8 kg)    Physical Exam  Constitutional: She appears well-developed and well-nourished. No distress.  Morbidly obese  HENT:  Head: Normocephalic and atraumatic.  Eyes: EOM are normal. No scleral icterus.  Neck: No thyromegaly present.  Cardiovascular: Normal rate, regular rhythm and normal heart sounds.   No murmur heard. Pulmonary/Chest: Effort normal and breath sounds normal. No respiratory distress. She has no wheezes.  Abdominal: Soft. Normal appearance and bowel sounds are normal. She exhibits no distension. There is no tenderness.  There is no rigidity and no guarding.  Musculoskeletal: She exhibits no edema.  Neurological: She is alert. She exhibits normal muscle tone.  Skin: Skin is warm and dry. She is not diaphoretic. No pallor.  Callus sole of LEFT foot  Psychiatric: She has a normal mood and affect. Her behavior is normal. Judgment and thought content normal.   Diabetic Foot Form - Detailed   Diabetic Foot Exam - detailed Diabetic Foot exam was performed with the following findings:  Yes 04/17/2016  8:35 AM  Visual Foot Exam completed.:  Yes  Are the toenails ingrown?:  No Normal Range of Motion:  Yes Pulse Foot Exam completed.:  Yes  Right Dorsalis Pedis:  Present Left Dorsalis Pedis:  Present  Sensory Foot Exam Completed.:  Yes Swelling:  No Semmes-Weinstein Monofilament Test R Site 1-Great Toe:  Pos L Site 1-Great Toe:  Pos  R Site 4:  Pos L Site 4:  Pos  R Site 5:  Pos L Site 5:  Pos        Results for orders placed or performed in visit on 01/17/16  TSH  Result Value Ref Range   TSH 0.91 mIU/L  Hemoglobin A1c  Result Value Ref Range   Hgb A1c MFr Bld 8.3 (H) <5.7 %   Mean Plasma Glucose 192 mg/dL  Lipid panel  Result Value Ref Range   Cholesterol 142 125 - 200 mg/dL   Triglycerides 98 <409<150 mg/dL   HDL 53 >=81>=46 mg/dL   Total CHOL/HDL Ratio 2.7 <=5.0 Ratio   VLDL 20 <30 mg/dL   LDL Cholesterol 69 <191<130 mg/dL  Comprehensive metabolic panel  Result Value Ref Range   Sodium 141 135 - 146 mmol/L   Potassium 4.3 3.5 - 5.3 mmol/L   Chloride 107 98 - 110 mmol/L   CO2 21 20 - 31 mmol/L   Glucose, Bld 144 (H) 65 - 99 mg/dL   BUN 13 7 - 25 mg/dL   Creat 4.780.66 2.950.50 - 6.211.05 mg/dL   Total Bilirubin 0.3 0.2 - 1.2 mg/dL   Alkaline Phosphatase 54 33 - 130 U/L   AST 24 10 - 35 U/L   ALT 34 (H) 6 - 29 U/L   Total Protein 7.1 6.1 - 8.1 g/dL   Albumin 3.7 3.6 - 5.1 g/dL   Calcium 8.7 8.6 - 30.810.4 mg/dL  Microalbumin / creatinine urine ratio  Result Value Ref Range   Creatinine, Urine 78 20 - 320 mg/dL     Microalb, Ur 0.8 Not estab mg/dL   Microalb Creat Ratio 10 <30 mcg/mg creat      Assessment & Plan:   Problem List Items Addressed This Visit      Cardiovascular and Mediastinum   Essential  hypertension    Well-controlled today        Digestive   Dysphagia    Refer to GI; start       Relevant Orders   Ambulatory referral to Gastroenterology   Acid reflux    Worse since chemical exposure; start omeprazole and refer to GI      Relevant Medications   omeprazole (PRILOSEC) 20 MG capsule   Other Relevant Orders   Ambulatory referral to Gastroenterology     Endocrine   Diabetes mellitus, type 2 (HCC)    Foot exam by MD; check A1c, glucose; work on weight loss, limit whites      Relevant Orders   Hemoglobin A1c   Microalbumin / creatinine urine ratio     Other   Tobacco abuse    Encouraged cessation; did not tolerate wellbutrin      Medication monitoring encounter    Check labs      Relevant Orders   COMPLETE METABOLIC PANEL WITH GFR   Hyperlipidemia    Limit saturated fats, check lipids      Relevant Orders   Lipid panel   Fatigue    Check TSH, vit D, cbc      Relevant Orders   CBC with Differential/Platelet   TSH   VITAMIN D 25 Hydroxy (Vit-D Deficiency, Fractures)   Extreme obesity (HCC)    Encouragement given to keep chipping away pound by pound, drink more water         Follow up plan: Return in about 3 months (around 07/18/2016) for fasting labs and visit.  An after-visit summary was printed and given to the patient at check-out.  Please see the patient instructions which may contain other information and recommendations beyond what is mentioned above in the assessment and plan.  Meds ordered this encounter  Medications  . omeprazole (PRILOSEC) 20 MG capsule    Sig: Take 1 capsule (20 mg total) by mouth daily.    Dispense:  30 capsule    Refill:  0    Orders Placed This Encounter  Procedures  . Lipid panel  . CBC with  Differential/Platelet  . Hemoglobin A1c  . Microalbumin / creatinine urine ratio  . TSH  . COMPLETE METABOLIC PANEL WITH GFR  . VITAMIN D 25 Hydroxy (Vit-D Deficiency, Fractures)  . Ambulatory referral to Gastroenterology

## 2016-04-17 NOTE — Assessment & Plan Note (Signed)
Check TSH, vit D, cbc

## 2016-04-17 NOTE — Assessment & Plan Note (Signed)
Check labs 

## 2016-04-17 NOTE — Assessment & Plan Note (Signed)
Refer to GI; start

## 2016-04-17 NOTE — Assessment & Plan Note (Signed)
Encouraged cessation; did not tolerate wellbutrin

## 2016-04-17 NOTE — Assessment & Plan Note (Signed)
Well controlled today.

## 2016-04-17 NOTE — Assessment & Plan Note (Signed)
Limit saturated fats, check lipids 

## 2016-04-17 NOTE — Patient Instructions (Addendum)
Check out the information at familydoctor.org entitled "Nutrition for Weight Loss: What You Need to Know about Fad Diets" Try to lose between 1-2 pounds per week by taking in fewer calories and burning off more calories You can succeed by limiting portions, limiting foods dense in calories and fat, becoming more active, and drinking 8 glasses of water a day (64 ounces) Don't skip meals, especially breakfast, as skipping meals may alter your metabolism Do not use over-the-counter weight loss pills or gimmicks that claim rapid weight loss A healthy BMI (or body mass index) is between 18.5 and 24.9 You can calculate your ideal BMI at the NIH website JobEconomics.huhttp://www.nhlbi.nih.gov/health/educational/lose_wt/BMI/bmicalc.htm  Try to limit saturated fats in your diet (bologna, hot dogs, barbeque, cheeseburgers, hamburgers, steak, bacon, sausage, cheese, etc.) and get more fresh fruits, vegetables, and whole grains  We'll get labs today We'll have you see the gastroenterologist Start omeprazole daily for one month Toss that bathroom cleaner

## 2016-04-17 NOTE — Assessment & Plan Note (Signed)
Encouragement given to keep chipping away pound by pound, drink more water

## 2016-04-17 NOTE — Assessment & Plan Note (Signed)
Foot exam by MD; check A1c, glucose; work on weight loss, limit whites

## 2016-04-18 LAB — MICROALBUMIN / CREATININE URINE RATIO
CREATININE, URINE: 77 mg/dL (ref 20–320)
MICROALB UR: 1.1 mg/dL
Microalb Creat Ratio: 14 mcg/mg creat (ref <30)

## 2016-04-26 ENCOUNTER — Other Ambulatory Visit: Payer: Self-pay | Admitting: Family Medicine

## 2016-04-26 MED ORDER — CANAGLIFLOZIN 300 MG PO TABS: 300.0000 mg | ORAL_TABLET | Freq: Every day | ORAL | 2 refills | Status: DC

## 2016-04-26 NOTE — Progress Notes (Signed)
mychart note to patient

## 2016-04-27 ENCOUNTER — Encounter: Payer: Self-pay | Admitting: Family Medicine

## 2016-05-02 ENCOUNTER — Other Ambulatory Visit: Payer: Self-pay | Admitting: Family Medicine

## 2016-05-02 NOTE — Telephone Encounter (Signed)
Last labs reviewed; Rxs approved 

## 2016-05-03 ENCOUNTER — Other Ambulatory Visit: Payer: Self-pay | Admitting: Family Medicine

## 2016-05-03 ENCOUNTER — Ambulatory Visit: Payer: BLUE CROSS/BLUE SHIELD | Admitting: Gastroenterology

## 2016-05-14 ENCOUNTER — Other Ambulatory Visit: Payer: Self-pay | Admitting: Family Medicine

## 2016-05-29 NOTE — Telephone Encounter (Signed)
There was a glitch in our system; refill requests were turned off 05/09/16 and I did not receive this request until today Rx sent 

## 2016-06-01 ENCOUNTER — Encounter: Payer: Self-pay | Admitting: Family Medicine

## 2016-06-01 MED ORDER — CANAGLIFLOZIN 300 MG PO TABS: 300.0000 mg | ORAL_TABLET | Freq: Every day | ORAL | 1 refills | Status: DC

## 2016-06-29 ENCOUNTER — Telehealth: Payer: Self-pay

## 2016-06-29 MED ORDER — SITAGLIPTIN PHOS-METFORMIN HCL 50-1000 MG PO TABS: 1.0000 | ORAL_TABLET | Freq: Two times a day (BID) | ORAL | 0 refills | Status: DC

## 2016-06-29 NOTE — Telephone Encounter (Signed)
Last labs reviewed; Rx sent

## 2016-06-29 NOTE — Telephone Encounter (Signed)
Patient is requesting a refill on janumet 50-1,000 mg tab and 60 Qty 1 refill. I see is no longer on pt med list.

## 2016-07-19 ENCOUNTER — Ambulatory Visit: Payer: BLUE CROSS/BLUE SHIELD | Admitting: Family Medicine

## 2016-08-20 ENCOUNTER — Other Ambulatory Visit: Payer: Self-pay

## 2016-08-20 MED ORDER — ATORVASTATIN CALCIUM 40 MG PO TABS: 40.0000 mg | ORAL_TABLET | Freq: Every day | ORAL | 0 refills | Status: DC

## 2016-08-20 MED ORDER — LOSARTAN POTASSIUM-HCTZ 100-12.5 MG PO TABS: 1.0000 | ORAL_TABLET | Freq: Every day | ORAL | 0 refills | Status: DC

## 2016-08-20 NOTE — Telephone Encounter (Signed)
Patient was due for visit around Feb 21st; patient now has appt April 5th; will get labs then Rxs sent

## 2016-08-20 NOTE — Telephone Encounter (Signed)
Patient request from local pharmacy

## 2016-08-30 ENCOUNTER — Encounter: Payer: Self-pay | Admitting: Family Medicine

## 2016-08-30 ENCOUNTER — Ambulatory Visit (INDEPENDENT_AMBULATORY_CARE_PROVIDER_SITE_OTHER): Payer: BLUE CROSS/BLUE SHIELD | Admitting: Family Medicine

## 2016-08-30 VITALS — BP 114/62 | HR 86 | Temp 97.9°F | Resp 16 | Wt 251.6 lb

## 2016-08-30 DIAGNOSIS — E782 Mixed hyperlipidemia: Secondary | ICD-10-CM

## 2016-08-30 DIAGNOSIS — G4733 Obstructive sleep apnea (adult) (pediatric): Secondary | ICD-10-CM

## 2016-08-30 DIAGNOSIS — Z5181 Encounter for therapeutic drug level monitoring: Secondary | ICD-10-CM

## 2016-08-30 DIAGNOSIS — Z720 Tobacco use: Secondary | ICD-10-CM

## 2016-08-30 DIAGNOSIS — I10 Essential (primary) hypertension: Secondary | ICD-10-CM | POA: Diagnosis not present

## 2016-08-30 DIAGNOSIS — J301 Allergic rhinitis due to pollen: Secondary | ICD-10-CM

## 2016-08-30 DIAGNOSIS — E668 Other obesity: Secondary | ICD-10-CM

## 2016-08-30 DIAGNOSIS — E1165 Type 2 diabetes mellitus with hyperglycemia: Secondary | ICD-10-CM

## 2016-08-30 LAB — COMPLETE METABOLIC PANEL WITH GFR
ALT: 28 U/L (ref 6–29)
AST: 21 U/L (ref 10–35)
Albumin: 4.2 g/dL (ref 3.6–5.1)
Alkaline Phosphatase: 62 U/L (ref 33–130)
BUN: 15 mg/dL (ref 7–25)
CALCIUM: 9.7 mg/dL (ref 8.6–10.4)
CHLORIDE: 102 mmol/L (ref 98–110)
CO2: 23 mmol/L (ref 20–31)
CREATININE: 0.81 mg/dL (ref 0.50–1.05)
GFR, Est African American: >89 mL/min (ref >=60)
GFR, Est Non African American: 83 mL/min (ref >=60)
Glucose, Bld: 196 mg/dL — ABNORMAL HIGH (ref 65–99)
Potassium: 4.3 mmol/L (ref 3.5–5.3)
Sodium: 137 mmol/L (ref 135–146)
TOTAL PROTEIN: 7.8 g/dL (ref 6.1–8.1)
Total Bilirubin: 0.5 mg/dL (ref 0.2–1.2)

## 2016-08-30 LAB — CBC WITH DIFFERENTIAL/PLATELET
BASOS ABS: 0 {cells}/uL (ref 0–200)
Basophils Relative: 0 %
EOS PCT: 1 %
Eosinophils Absolute: 124 cells/uL (ref 15–500)
HEMATOCRIT: 48.5 % — AB (ref 35.0–45.0)
HEMOGLOBIN: 15.5 g/dL (ref 11.7–15.5)
LYMPHS ABS: 2604 {cells}/uL (ref 850–3900)
LYMPHS PCT: 21 %
MCH: 27.3 pg (ref 27.0–33.0)
MCHC: 32 g/dL (ref 32.0–36.0)
MCV: 85.4 fL (ref 80.0–100.0)
MPV: 11.2 fL (ref 7.5–12.5)
Monocytes Absolute: 1488 cells/uL — ABNORMAL HIGH (ref 200–950)
Monocytes Relative: 12 %
NEUTROS PCT: 66 %
Neutro Abs: 8184 cells/uL — ABNORMAL HIGH (ref 1500–7800)
Platelets: 237 10*3/uL (ref 140–400)
RBC: 5.68 MIL/uL — AB (ref 3.80–5.10)
RDW: 15.5 % — AB (ref 11.0–15.0)
WBC: 12.4 10*3/uL — AB (ref 3.8–10.8)

## 2016-08-30 LAB — LIPID PANEL
CHOL/HDL RATIO: 3.5 ratio (ref <5.0)
Cholesterol: 176 mg/dL (ref <200)
HDL: 50 mg/dL — ABNORMAL LOW (ref >50)
LDL Cholesterol: 98 mg/dL (ref <100)
TRIGLYCERIDES: 141 mg/dL (ref <150)
VLDL: 28 mg/dL (ref <30)

## 2016-08-30 MED ORDER — LOSARTAN POTASSIUM 100 MG PO TABS: 100.0000 mg | ORAL_TABLET | Freq: Every day | ORAL | 3 refills | Status: DC

## 2016-08-30 NOTE — Assessment & Plan Note (Addendum)
controlledbut some fatigue, so will drop the HCTZ; pt will check BP at home and call if >130; try DASH guidelines and lose weight

## 2016-08-30 NOTE — Assessment & Plan Note (Signed)
Encouraged cessation- not ready to quit 

## 2016-08-30 NOTE — Assessment & Plan Note (Signed)
Work on weight loss, foot exam by MD; eye exam UTD

## 2016-08-30 NOTE — Patient Instructions (Addendum)
Stop the combination losartan/hctz pill for blood pressure Start the plain losartan Keep an eye on the blood pressure and notify if top number going up over 130  Try to follow the DASH guidelines (DASH stands for Dietary Approaches to Stop Hypertension) Try to limit the sodium in your diet.  Ideally, consume less than 1.5 grams (less than 1,500mg ) per day. Do not add salt when cooking or at the table.  Check the sodium amount on labels when shopping, and choose items lower in sodium when given a choice. Avoid or limit foods that already contain a lot of sodium. Eat a diet rich in fruits and vegetables and whole grains.  I do encourage you to quit smoking Call [phone] to sign up for smoking cessation classes You can call 1-800-QUIT-NOW to talk with a smoking cessation coach  Check out the information at familydoctor.org entitled "Nutrition for Weight Loss: What You Need to Know about Fad Diets" Try to lose between 1-2 pounds per week by taking in fewer calories and burning off more calories You can succeed by limiting portions, limiting foods dense in calories and fat, becoming more active, and drinking 8 glasses of water a day (64 ounces) Don't skip meals, especially breakfast, as skipping meals may alter your metabolism Do not use over-the-counter weight loss pills or gimmicks that claim rapid weight loss A healthy BMI (or body mass index) is between 18.5 and 24.9 You can calculate your ideal BMI at the NIH website JobEconomics.hu    DASH Eating Plan DASH stands for "Dietary Approaches to Stop Hypertension." The DASH eating plan is a healthy eating plan that has been shown to reduce high blood pressure (hypertension). It may also reduce your risk for type 2 diabetes, heart disease, and stroke. The DASH eating plan may also help with weight loss. What are tips for following this plan? General guidelines   Avoid eating more than 2,300  mg (milligrams) of salt (sodium) a day. If you have hypertension, you may need to reduce your sodium intake to 1,500 mg a day.  Limit alcohol intake to no more than 1 drink a day for nonpregnant women and 2 drinks a day for men. One drink equals 12 oz of beer, 5 oz of wine, or 1 oz of hard liquor.  Work with your health care provider to maintain a healthy body weight or to lose weight. Ask what an ideal weight is for you.  Get at least 30 minutes of exercise that causes your heart to beat faster (aerobic exercise) most days of the week. Activities may include walking, swimming, or biking.  Work with your health care provider or diet and nutrition specialist (dietitian) to adjust your eating plan to your individual calorie needs. Reading food labels   Check food labels for the amount of sodium per serving. Choose foods with less than 5 percent of the Daily Value of sodium. Generally, foods with less than 300 mg of sodium per serving fit into this eating plan.  To find whole grains, look for the word "whole" as the first word in the ingredient list. Shopping   Buy products labeled as "low-sodium" or "no salt added."  Buy fresh foods. Avoid canned foods and premade or frozen meals. Cooking   Avoid adding salt when cooking. Use salt-free seasonings or herbs instead of table salt or sea salt. Check with your health care provider or pharmacist before using salt substitutes.  Do not fry foods. Cook foods using healthy methods such as baking,  boiling, grilling, and broiling instead.  Cook with heart-healthy oils, such as olive, canola, soybean, or sunflower oil. Meal planning    Eat a balanced diet that includes:  5 or more servings of fruits and vegetables each day. At each meal, try to fill half of your plate with fruits and vegetables.  Up to 6-8 servings of whole grains each day.  Less than 6 oz of lean meat, poultry, or fish each day. A 3-oz serving of meat is about the same size as  a deck of cards. One egg equals 1 oz.  2 servings of low-fat dairy each day.  A serving of nuts, seeds, or beans 5 times each week.  Heart-healthy fats. Healthy fats called Omega-3 fatty acids are found in foods such as flaxseeds and coldwater fish, like sardines, salmon, and mackerel.  Limit how much you eat of the following:  Canned or prepackaged foods.  Food that is high in trans fat, such as fried foods.  Food that is high in saturated fat, such as fatty meat.  Sweets, desserts, sugary drinks, and other foods with added sugar.  Full-fat dairy products.  Do not salt foods before eating.  Try to eat at least 2 vegetarian meals each week.  Eat more home-cooked food and less restaurant, buffet, and fast food.  When eating at a restaurant, ask that your food be prepared with less salt or no salt, if possible. What foods are recommended? The items listed may not be a complete list. Talk with your dietitian about what dietary choices are best for you. Grains  Whole-grain or whole-wheat bread. Whole-grain or whole-wheat pasta. Brown rice. Orpah Cobb. Bulgur. Whole-grain and low-sodium cereals. Pita bread. Low-fat, low-sodium crackers. Whole-wheat flour tortillas. Vegetables  Fresh or frozen vegetables (raw, steamed, roasted, or grilled). Low-sodium or reduced-sodium tomato and vegetable juice. Low-sodium or reduced-sodium tomato sauce and tomato paste. Low-sodium or reduced-sodium canned vegetables. Fruits  All fresh, dried, or frozen fruit. Canned fruit in natural juice (without added sugar). Meat and other protein foods  Skinless chicken or [place]. Ground chicken or [place]. Pork with fat trimmed off. Fish and seafood. Egg whites. Dried beans, peas, or lentils. Unsalted nuts, nut butters, and seeds. Unsalted canned beans. Lean cuts of beef with fat trimmed off. Low-sodium, lean deli meat. Dairy  Low-fat (1%) or fat-free (skim) milk. Fat-free, low-fat, or reduced-fat cheeses.  Nonfat, low-sodium ricotta or cottage cheese. Low-fat or nonfat yogurt. Low-fat, low-sodium cheese. Fats and oils  Soft margarine without trans fats. Vegetable oil. Low-fat, reduced-fat, or light mayonnaise and salad dressings (reduced-sodium). Canola, safflower, olive, soybean, and sunflower oils. Avocado. Seasoning and other foods  Herbs. Spices. Seasoning mixes without salt. Unsalted popcorn and pretzels. Fat-free sweets. What foods are not recommended? The items listed may not be a complete list. Talk with your dietitian about what dietary choices are best for you. Grains  Baked goods made with fat, such as croissants, muffins, or some breads. Dry pasta or rice meal packs. Vegetables  Creamed or fried vegetables. Vegetables in a cheese sauce. Regular canned vegetables (not low-sodium or reduced-sodium). Regular canned tomato sauce and paste (not low-sodium or reduced-sodium). Regular tomato and vegetable juice (not low-sodium or reduced-sodium). Rosita Fire. Olives. Fruits  Canned fruit in a light or heavy syrup. Fried fruit. Fruit in cream or butter sauce. Meat and other protein foods  Fatty cuts of meat. Ribs. Fried meat. Tomasa Blase. Sausage. Bologna and other processed lunch meats. Salami. Fatback. Hotdogs. Bratwurst. Salted nuts and seeds. Canned beans  with added salt. Canned or smoked fish. Whole eggs or egg yolks. Chicken or [place] with skin. Dairy  Whole or 2% milk, cream, and half-and-half. Whole or full-fat cream cheese. Whole-fat or sweetened yogurt. Full-fat cheese. Nondairy creamers. Whipped toppings. Processed cheese and cheese spreads. Fats and oils  Butter. Stick margarine. Lard. Shortening. Ghee. Bacon fat. Tropical oils, such as coconut, palm kernel, or palm oil. Seasoning and other foods  Salted popcorn and pretzels. Onion salt, garlic salt, seasoned salt, table salt, and sea salt. Worcestershire sauce. Tartar sauce. Barbecue sauce. Teriyaki sauce. Soy sauce, including  reduced-sodium. Steak sauce. Canned and packaged gravies. Fish sauce. Oyster sauce. Cocktail sauce. Horseradish that you find on the shelf. Ketchup. Mustard. Meat flavorings and tenderizers. Bouillon cubes. Hot sauce and Tabasco sauce. Premade or packaged marinades. Premade or packaged taco seasonings. Relishes. Regular salad dressings. Where to find more information:  National Heart, Lung, and Blood Institute: PopSteam.is  American Heart Association: www.heart.org Summary  The DASH eating plan is a healthy eating plan that has been shown to reduce high blood pressure (hypertension). It may also reduce your risk for type 2 diabetes, heart disease, and stroke.  With the DASH eating plan, you should limit salt (sodium) intake to 2,300 mg a day. If you have hypertension, you may need to reduce your sodium intake to 1,500 mg a day.  When on the DASH eating plan, aim to eat more fresh fruits and vegetables, whole grains, lean proteins, low-fat dairy, and heart-healthy fats.  Work with your health care provider or diet and nutrition specialist (dietitian) to adjust your eating plan to your individual calorie needs. This information is not intended to replace advice given to you by your health care provider. Make sure you discuss any questions you have with your health care provider. Document Released: 05/03/2011 Document Revised: 05/07/2016 Document Reviewed: 05/07/2016 Elsevier Interactive Patient Education  2017 [employer].

## 2016-08-30 NOTE — Assessment & Plan Note (Signed)
Not using CPAP; encouraged regular use

## 2016-08-30 NOTE — Progress Notes (Signed)
BP 114/62   Pulse 86   Temp 97.9 F (36.6 C) (Oral)   Resp 16   Wt 251 lb 9.6 oz (114.1 kg)   SpO2 96%   BMI 47.54 kg/m    Subjective:    Patient ID: Ariel Johnston, female    DOB: 08-17-1960, 56 y.o.   MRN: [id number]  HPI: Ariel Johnston is a 56 y.o. female  Chief Complaint  Patient presents with  . Follow-up   Patient is here for follow-up Since last visit, she saw cardiologist about palpitations; thought tto be bupropion; return PRN  Some fatigue; has allergies; no decongestants  Diabetes mellitus type 2 Checking sugars?  yes How often? Occasionally Does patient feel additional teaching/training would be helpful?  no Trying to limit white bread, white rice, white potatoes, sweets?  yes, some potatoes, wheat bread Trying to limit sweetened drinks like iced tea, soft drinks, sports drinks, fruit juices?  no, [employer], weaning off diet sodas and doing herbal teas Checking feet every day/night?  yes Last eye exam:  Nov or Dec 2017  Hypertension Patient has had hypertension since many years ago  Checking blood pressure away from here?  no  If taking medicines, are you taking them regularly?  yes  Siblings / family history: Does high blood pressure run in your family?   YES, grandparents, aunts, youngest brother  Salt:  Trying to limit sodium / salt when buying foods at the grocery store?  NO Do you try to limit added salt when cooking and at the table?  NO  Sudafed (decongestants): Do you use any OTC decongestant products like Allegra-D, Claritin-D, Zyrtec-D, Tylenol Cold and Sinus, etc.?  no  High cholesterol Dietary intake: Do you eat more than 3 eggs per week  YES Do you try to limit saturated fats in your diet?  NO Exercise: does walk Weight:  If overweight or obese, are you trying to lose weight?  yes, going to start Weight Watchers   Depression screen [facility] 2/9 08/30/2016 04/17/2016 01/17/2016 04/14/2015  Decreased Interest 0 0 0 0  Down,  Depressed, Hopeless 0 0 1 0  PHQ - 2 Score 0 0 1 0    Relevant past medical, surgical, family and social history reviewed Past Medical History:  Diagnosis Date  . Depression   . Diabetes mellitus without complication (HCC)   . GERD (gastroesophageal reflux disease)   . Hyperlipidemia   . Hypertension   . Sleep apnea    Past Surgical History:  Procedure Laterality Date  . COLONOSCOPY WITH PROPOFOL N/A 12/06/2015   Procedure: COLONOSCOPY WITH PROPOFOL;  Surgeon: Midge Minium, MD;  Location: [facility] ENDOSCOPY;  Service: Endoscopy;  Laterality: N/A;  . DILATION AND CURETTAGE OF UTERUS  2007   Family History  Problem Relation Age of Onset  . Hypertension Brother   . Stroke Father    Social History  Substance Use Topics  . Smoking status: Current Every Day Smoker    Packs/day: 0.50    Years: 35.00    Types: Cigarettes  . Smokeless tobacco: Never Used  . Alcohol use No    Interim medical history since last visit reviewed. Allergies and medications reviewed  Review of Systems Per HPI unless specifically indicated above     Objective:    BP 114/62   Pulse 86   Temp 97.9 F (36.6 C) (Oral)   Resp 16   Wt 251 lb 9.6 oz (114.1 kg)   SpO2 96%   BMI  47.54 kg/m   Wt Readings from Last 3 Encounters:  08/30/16 251 lb 9.6 oz (114.1 kg)  04/17/16 255 lb 7 oz (115.9 kg)  03/28/16 256 lb 8 oz (116.3 kg)    Physical Exam  Constitutional: She appears well-developed and well-nourished. No distress.  Obese, weight down 4 pounds over last 5-1/2 months  HENT:  Head: Normocephalic and atraumatic.  Eyes: EOM are normal. No scleral icterus.  Neck: No thyromegaly present.  Cardiovascular: Normal rate, regular rhythm and normal heart sounds.   No murmur heard. Pulmonary/Chest: Effort normal and breath sounds normal. No respiratory distress. She has no wheezes.  Abdominal: Soft. Bowel sounds are normal. She exhibits no distension.  Musculoskeletal: Normal range of motion. She exhibits  no edema.  Neurological: She is alert. She exhibits normal muscle tone.  Skin: Skin is warm and dry. She is not diaphoretic. No pallor.  Psychiatric: She has a normal mood and affect. Her behavior is normal. Judgment and thought content normal.    Diabetic Foot Form - Detailed   Diabetic Foot Exam - detailed Diabetic Foot exam was performed with the following findings:  Yes 08/30/2016  8:42 AM  Visual Foot Exam completed.:  Yes  Are the toenails ingrown?:  No Normal Range of Motion:  Yes Pulse Foot Exam completed.:  Yes  Right Dorsalis Pedis:  Present Left Dorsalis Pedis:  Present  Sensory Foot Exam Completed.:  Yes Swelling:  No Semmes-Weinstein Monofilament Test R Site 1-Great Toe:  Pos L Site 1-Great Toe:  Pos  R Site 4:  Pos L Site 4:  Pos  R Site 5:  Pos L Site 5:  Pos          Assessment & Plan:   Problem List Items Addressed This Visit      Cardiovascular and Mediastinum   Essential hypertension    controlledbut some fatigue, so will drop the HCTZ; pt will check BP at home and call if >130; try DASH guidelines and lose weight      Relevant Medications   losartan (COZAAR) 100 MG tablet     Respiratory   Obstructive apnea    Not using CPAP; encouraged regular use      Allergic rhinitis, seasonal    Stay away from decongestants        Endocrine   Diabetes mellitus, type 2 (HCC)    Work on weight loss, foot exam by MD; eye exam UTD      Relevant Medications   losartan (COZAAR) 100 MG tablet   Other Relevant Orders   Hemoglobin A1c (Completed)     Other   Tobacco abuse    Encouraged cessation; not ready to quit      Medication monitoring encounter    Check labs      Relevant Orders   COMPLETE METABOLIC PANEL WITH GFR (Completed)   CBC with Differential/Platelet (Completed)   Hyperlipidemia - Primary   Relevant Medications   losartan (COZAAR) 100 MG tablet   Other Relevant Orders   Lipid panel (Completed)   Extreme obesity (HCC)    So excite  that patient is joining weight watchers; see AVS          Follow up plan: Return in about 3 months (around 11/29/2016) for twenty minute follow-up with fasting labs.  An after-visit summary was printed and given to the patient at check-out.  Please see the patient instructions which may contain other information and recommendations beyond what is mentioned above in the assessment  and plan.  Meds ordered this encounter  Medications  . losartan (COZAAR) 100 MG tablet    Sig: Take 1 tablet (100 mg total) by mouth daily.    Dispense:  90 tablet    Refill:  3    Orders Placed This Encounter  Procedures  . COMPLETE METABOLIC PANEL WITH GFR  . Hemoglobin A1c  . Lipid panel  . CBC with Differential/Platelet

## 2016-08-30 NOTE — Assessment & Plan Note (Signed)
Stay away from decongestants

## 2016-08-30 NOTE — Assessment & Plan Note (Signed)
So excite that patient is joining [employer]; see AVS

## 2016-08-30 NOTE — Assessment & Plan Note (Signed)
Check labs 

## 2016-08-31 LAB — HEMOGLOBIN A1C
Hgb A1c MFr Bld: 9.8 % — ABNORMAL HIGH (ref <5.7)
Mean Plasma Glucose: 235 mg/dL

## 2016-09-12 ENCOUNTER — Encounter: Payer: Self-pay | Admitting: Family Medicine

## 2016-09-15 ENCOUNTER — Telehealth: Payer: Self-pay | Admitting: Family Medicine

## 2016-09-15 NOTE — Telephone Encounter (Signed)
I spoke with patient about labs; apologized for the delay; high A1c She doesn't want another pill, she wants to work on weight loss alone She is taking both janumet pills together; advised her to take one before morning meal and one before evening meal Discussed sleep apnea, she started back on her machine WBC mildly elevated, may be inflammatory; no boils or sores We discussed cramps and diarrhea, just one day; no fever, no vomiting She'll see me in July and we'll hope to see better numbers

## 2016-09-19 ENCOUNTER — Ambulatory Visit (INDEPENDENT_AMBULATORY_CARE_PROVIDER_SITE_OTHER): Payer: BLUE CROSS/BLUE SHIELD | Admitting: Family Medicine

## 2016-09-19 ENCOUNTER — Encounter: Payer: Self-pay | Admitting: Family Medicine

## 2016-09-19 VITALS — BP 126/84 | HR 100 | Temp 98.4°F | Resp 14 | Wt 249.9 lb

## 2016-09-19 DIAGNOSIS — Z7189 Other specified counseling: Secondary | ICD-10-CM | POA: Diagnosis not present

## 2016-09-19 DIAGNOSIS — M7071 Other bursitis of hip, right hip: Secondary | ICD-10-CM | POA: Diagnosis not present

## 2016-09-19 MED ORDER — MELOXICAM 15 MG PO TABS: 15.0000 mg | ORAL_TABLET | Freq: Every day | ORAL | 0 refills | Status: DC

## 2016-09-19 MED ORDER — TRAMADOL HCL 50 MG PO TABS: 50.0000 mg | ORAL_TABLET | Freq: Four times a day (QID) | ORAL | 0 refills | Status: DC | PRN

## 2016-09-19 NOTE — Progress Notes (Signed)
BP 126/84   Pulse 100   Temp 98.4 F (36.9 C) (Oral)   Resp 14   Wt 249 lb 14.4 oz (113.4 kg)   SpO2 95%   BMI 47.22 kg/m    Subjective:    Patient ID: Ariel Johnston, female    DOB: 26-Feb-1961, 56 y.o.   MRN: [id number]  HPI: Ariel Johnston is a 56 y.o. female  Chief Complaint  Patient presents with  . Hip Pain    right radiates to butt   HPI Right hip hurting, woke up with this on Monday Hurting to get into bed Went into work anyway on Tuesday for half a day By 12 or 12:30, could hardly walk Couldn't walk at all last nght, needed help to go to the bathroom Tried icy hot; tried aleve but that didn't touch it Took some ibuprofen equate version and fell off to sleep last night Work up this am Right on the lateral hip; no back pain Can't do a lot of lifting because of the pain No electric shocks or tingling down the right leg No alcohol  Depression screen [facility] 2/9 08/30/2016 04/17/2016 01/17/2016 04/14/2015  Decreased Interest 0 0 0 0  Down, Depressed, Hopeless 0 0 1 0  PHQ - 2 Score 0 0 1 0   Relevant past medical, surgical, family and social history reviewed Past Medical History:  Diagnosis Date  . Depression   . Diabetes mellitus without complication ([place])   . GERD (gastroesophageal reflux disease)   . Hyperlipidemia   . Hypertension   . Sleep apnea    Past Surgical History:  Procedure Laterality Date  . COLONOSCOPY WITH PROPOFOL N/A 12/06/2015   Procedure: COLONOSCOPY WITH PROPOFOL;  Surgeon: Lucilla Lame, MD;  Location: [facility] ENDOSCOPY;  Service: Endoscopy;  Laterality: N/A;  . DILATION AND CURETTAGE OF UTERUS  2007   Family History  Problem Relation Age of Onset  . Hypertension Brother   . Stroke Father    Social History  Substance Use Topics  . Smoking status: Current Every Day Smoker    Packs/day: 0.50    Years: 35.00    Types: Cigarettes  . Smokeless tobacco: Never Used  . Alcohol use No   Interim medical history since last visit  reviewed. Allergies and medications reviewed  Review of Systems Per HPI unless specifically indicated above     Objective:    BP 126/84   Pulse 100   Temp 98.4 F (36.9 C) (Oral)   Resp 14   Wt 249 lb 14.4 oz (113.4 kg)   SpO2 95%   BMI 47.22 kg/m   Wt Readings from Last 3 Encounters:  09/19/16 249 lb 14.4 oz (113.4 kg)  08/30/16 251 lb 9.6 oz (114.1 kg)  04/17/16 255 lb 7 oz (115.9 kg)    Physical Exam  Constitutional: She appears well-developed and well-nourished.  HENT:  Mouth/Throat: Mucous membranes are normal.  Eyes: EOM are normal. No scleral icterus.  Cardiovascular: Normal rate and regular rhythm.   Pulmonary/Chest: Effort normal and breath sounds normal.  Musculoskeletal: She exhibits no edema.       Right hip: She exhibits decreased range of motion, tenderness and bony tenderness (tender over the RIGHT greater trochanter). She exhibits normal strength.  Decreased external rotation of the RIGHT hip (causes pain); decreased flexion of the RIGHT hip (causes pain); decreased extension of the RIGHT hip (causes pain)  Neurological:  Reflex Scores:      Patellar reflexes are 1+ on  the right side and 1+ on the left side. LE strength 5/5  Psychiatric: She has a normal mood and affect. Her behavior is normal.   Diabetic Foot Form - Detailed   Diabetic Foot Exam - detailed Diabetic Foot exam was performed with the following findings:  Yes 09/19/2016  8:59 AM  Visual Foot Exam completed.:  Yes  Are the toenails ingrown?:  No Normal Range of Motion:  Yes Pulse Foot Exam completed.:  Yes  Right Dorsalis Pedis:  Present Left Dorsalis Pedis:  Present  Sensory Foot Exam Completed.:  Yes Swelling:  No Semmes-Weinstein Monofilament Test R Site 1-Great Toe:  Pos L Site 1-Great Toe:  Pos  R Site 4:  Pos L Site 4:  Pos  R Site 5:  Pos L Site 5:  Pos        Results for orders placed or performed in visit on 08/30/16  COMPLETE METABOLIC PANEL WITH GFR  Result Value Ref  Range   Sodium 137 135 - 146 mmol/L   Potassium 4.3 3.5 - 5.3 mmol/L   Chloride 102 98 - 110 mmol/L   CO2 23 20 - 31 mmol/L   Glucose, Bld 196 (H) 65 - 99 mg/dL   BUN 15 7 - 25 mg/dL   Creat 0.81 0.50 - 1.05 mg/dL   Total Bilirubin 0.5 0.2 - 1.2 mg/dL   Alkaline Phosphatase 62 33 - 130 U/L   AST 21 10 - 35 U/L   ALT 28 6 - 29 U/L   Total Protein 7.8 6.1 - 8.1 g/dL   Albumin 4.2 3.6 - 5.1 g/dL   Calcium 9.7 8.6 - 10.4 mg/dL   GFR, Est African American >89 >=60 mL/min   GFR, Est Non African American 83 >=60 mL/min  Hemoglobin A1c  Result Value Ref Range   Hgb A1c MFr Bld 9.8 (H) <5.7 %   Mean Plasma Glucose 235 mg/dL  Lipid panel  Result Value Ref Range   Cholesterol 176 <200 mg/dL   Triglycerides 141 <150 mg/dL   HDL 50 (L) >50 mg/dL   Total CHOL/HDL Ratio 3.5 <5.0 Ratio   VLDL 28 <30 mg/dL   LDL Cholesterol 98 <100 mg/dL  CBC with Differential/Platelet  Result Value Ref Range   WBC 12.4 (H) 3.8 - 10.8 K/uL   RBC 5.68 (H) 3.80 - 5.10 MIL/uL   Hemoglobin 15.5 11.7 - 15.5 g/dL   HCT 48.5 (H) 35.0 - 45.0 %   MCV 85.4 80.0 - 100.0 fL   MCH 27.3 27.0 - 33.0 pg   MCHC 32.0 32.0 - 36.0 g/dL   RDW 15.5 (H) 11.0 - 15.0 %   Platelets 237 140 - 400 K/uL   MPV 11.2 7.5 - 12.5 fL   Neutro Abs 8,184 (H) 1,500 - 7,800 cells/uL   Lymphs Abs 2,604 850 - 3,900 cells/uL   Monocytes Absolute 1,488 (H) 200 - 950 cells/uL   Eosinophils Absolute 124 15 - 500 cells/uL   Basophils Absolute 0 0 - 200 cells/uL   Neutrophils Relative % 66 %   Lymphocytes Relative 21 %   Monocytes Relative 12 %   Eosinophils Relative 1 %   Basophils Relative 0 %   Smear Review Criteria for review not met       Assessment & Plan:   Problem List Items Addressed This Visit    None    Visit Diagnoses    Bursitis of right hip, unspecified bursa    -  Primary   explained  suspected diagnosis, treatment options; she does not want prednisone; will use NSAID, no other NSAIDS OTC; risk of ulcer; tramadol Rx, call  PRN   Pain medication agreement discussed       Rx for tramadol given; explained no alcohol while on this; no formal contract signed, as this is short-term; STOP act, reviewed [name] web site last 1 year       Follow up plan: No Follow-up on file.  An after-visit summary was printed and given to the patient at [facility].  Please see the patient instructions which may contain other information and recommendations beyond what is mentioned above in the assessment and plan.  Meds ordered this encounter  Medications  . meloxicam (MOBIC) 15 MG tablet    Sig: Take 1 tablet (15 mg total) by mouth daily. For the first 7-10 days, then daily just as needed    Dispense:  30 tablet    Refill:  0  . traMADol (ULTRAM) 50 MG tablet    Sig: Take 1 tablet (50 mg total) by mouth every 6 (six) hours as needed.    Dispense:  15 tablet    Refill:  0    No orders of the defined types were placed in this encounter.

## 2016-09-19 NOTE — Patient Instructions (Addendum)
Hip Bursitis Hip bursitis is inflammation of a fluid-filled sac (bursa) in the hip joint. The bursa protects the bones in the hip joint from rubbing against each other. Hip bursitis can cause mild to moderate pain, and symptoms often come and go over time. What are the causes? This condition may be caused by:  Injury to the hip.  Overuse of the muscles that surround the hip joint.  Arthritis or gout.  Diabetes.  Thyroid disease.  Cold weather.  Infection. In some cases, the cause may not be known. What are the signs or symptoms? Symptoms of this condition may include:  Mild or moderate pain in the hip area. Pain may get worse with movement.  Tenderness and swelling of the hip, especially on the outer side of the hip. Symptoms may come and go. If the bursa becomes infected, you may have the following symptoms:  Fever.  Red skin and a feeling of warmth in the hip area. How is this diagnosed? This condition may be diagnosed based on:  A physical exam.  Your medical history.  X-rays.  Removal of fluid from your inflamed bursa for testing (biopsy). You may be sent to a health care provider who specializes in bone diseases (orthopedist) or a provider who specializes in joint inflammation (rheumatologist). How is this treated? This condition is treated by resting, raising (elevating), and applying pressure(compression) to the injured area. In some cases, this may be enough to make your symptoms go away. Treatment may also include:  Crutches.  Antibiotic medicine.  Draining fluid out of the bursa to help relieve swelling.  Injecting medicine that helps to reduce inflammation (cortisone). Follow these instructions at home: Medicines   Take over-the-counter and prescription medicines only as told by your health care provider.  Do not drive or operate heavy machinery while taking prescription pain medicine, or as told by your health care provider.  If you were  prescribed an antibiotic, take it as told by your health care provider. Do not stop taking the antibiotic even if you start to feel better. Activity   Return to your normal activities as told by your health care provider. Ask your health care provider what activities are safe for you.  Rest and protect your hip as much as possible until your pain and swelling get better. General instructions   Wear compression wraps only as told by your health care provider.  Elevate your hip above the level of your heart as much as you can without pain. To do this, try putting a pillow under your hips while you lie down.  Do not use your hip to support your body weight until your health care provider says that you can. Use crutches as told by your health care provider.  Gently massage and stretch your injured area as often as is comfortable.  Keep all follow-up visits as told by your health care provider. This is important. How is this prevented?  Exercise regularly, as told by your health care provider.  Warm up and stretch before being active.  Cool down and stretch after being active.  If an activity irritates your hip or causes pain, avoid the activity as much as possible.  Avoid sitting down for long periods at a time. Contact a health care provider if:  You have a fever.  You develop new symptoms.  You have difficulty walking or doing everyday activities.  You have pain that gets worse or does not get better with medicine.  You develop red  skin or a feeling of warmth in your hip area. Get help right away if:  You cannot move your hip.  You have severe pain. This information is not intended to replace advice given to you by your health care provider. Make sure you discuss any questions you have with your health care provider. Document Released: 11/03/2001 Document Revised: 10/20/2015 Document Reviewed: 12/14/2014 Elsevier Interactive Patient Education  2017 [employer].  Stop  any over-the-counter NSAIDs Use the meloxicam daily for the next 7-10 days; take with food Okay to take tylenol per package directions Okay to use tramadol if needed for breakthrough pain Call if not improving

## 2016-10-13 ENCOUNTER — Other Ambulatory Visit: Payer: Self-pay | Admitting: Family Medicine

## 2016-10-13 NOTE — Telephone Encounter (Signed)
Reviewed last Cr April 2018; Rx approved

## 2016-10-16 ENCOUNTER — Other Ambulatory Visit: Payer: Self-pay | Admitting: Family Medicine

## 2016-11-13 ENCOUNTER — Other Ambulatory Visit: Payer: Self-pay | Admitting: Family Medicine

## 2016-11-13 NOTE — Telephone Encounter (Signed)
Reviewed last note, last Cr; Rx approved

## 2016-11-15 ENCOUNTER — Other Ambulatory Visit: Payer: Self-pay | Admitting: Family Medicine

## 2016-11-15 NOTE — Telephone Encounter (Signed)
Reviewed last sgpt and lipids; rx approved 

## 2016-11-25 ENCOUNTER — Other Ambulatory Visit: Payer: Self-pay | Admitting: Family Medicine

## 2016-11-25 DIAGNOSIS — E1165 Type 2 diabetes mellitus with hyperglycemia: Secondary | ICD-10-CM

## 2016-11-26 MED ORDER — CANAGLIFLOZIN 300 MG PO TABS: 300.0000 mg | ORAL_TABLET | Freq: Every day | ORAL | 0 refills | Status: DC

## 2016-11-26 NOTE — Telephone Encounter (Signed)
Pt needs to attend appointment with PCP Dr. Sherie DonLada on 12/11/16 for further refills.

## 2016-12-11 ENCOUNTER — Encounter: Payer: Self-pay | Admitting: Family Medicine

## 2016-12-11 ENCOUNTER — Ambulatory Visit (INDEPENDENT_AMBULATORY_CARE_PROVIDER_SITE_OTHER): Payer: BLUE CROSS/BLUE SHIELD | Admitting: Family Medicine

## 2016-12-11 VITALS — BP 128/82 | HR 96 | Temp 98.0°F | Resp 16 | Wt 243.7 lb

## 2016-12-11 DIAGNOSIS — E1165 Type 2 diabetes mellitus with hyperglycemia: Secondary | ICD-10-CM | POA: Diagnosis not present

## 2016-12-11 DIAGNOSIS — E668 Other obesity: Secondary | ICD-10-CM

## 2016-12-11 DIAGNOSIS — E782 Mixed hyperlipidemia: Secondary | ICD-10-CM

## 2016-12-11 DIAGNOSIS — R87610 Atypical squamous cells of undetermined significance on cytologic smear of cervix (ASC-US): Secondary | ICD-10-CM | POA: Diagnosis not present

## 2016-12-11 DIAGNOSIS — Z5181 Encounter for therapeutic drug level monitoring: Secondary | ICD-10-CM

## 2016-12-11 DIAGNOSIS — I10 Essential (primary) hypertension: Secondary | ICD-10-CM | POA: Diagnosis not present

## 2016-12-11 DIAGNOSIS — G8929 Other chronic pain: Secondary | ICD-10-CM | POA: Insufficient documentation

## 2016-12-11 DIAGNOSIS — M25551 Pain in right hip: Secondary | ICD-10-CM

## 2016-12-11 DIAGNOSIS — D72829 Elevated white blood cell count, unspecified: Secondary | ICD-10-CM

## 2016-12-11 DIAGNOSIS — Z720 Tobacco use: Secondary | ICD-10-CM

## 2016-12-11 LAB — CBC WITH DIFFERENTIAL/PLATELET
BASOS ABS: 0 {cells}/uL (ref 0–200)
BASOS PCT: 0 %
EOS ABS: 110 {cells}/uL (ref 15–500)
Eosinophils Relative: 1 %
HEMATOCRIT: 48.6 % — AB (ref 35.0–45.0)
HEMOGLOBIN: 15.8 g/dL — AB (ref 11.7–15.5)
LYMPHS ABS: 2750 {cells}/uL (ref 850–3900)
Lymphocytes Relative: 25 %
MCH: 28 pg (ref 27.0–33.0)
MCHC: 32.5 g/dL (ref 32.0–36.0)
MCV: 86 fL (ref 80.0–100.0)
MONO ABS: 1320 {cells}/uL — AB (ref 200–950)
MPV: 11 fL (ref 7.5–12.5)
Monocytes Relative: 12 %
NEUTROS ABS: 6820 {cells}/uL (ref 1500–7800)
Neutrophils Relative %: 62 %
Platelets: 214 10*3/uL (ref 140–400)
RBC: 5.65 MIL/uL — ABNORMAL HIGH (ref 3.80–5.10)
RDW: 15.7 % — ABNORMAL HIGH (ref 11.0–15.0)
WBC: 11 10*3/uL — ABNORMAL HIGH (ref 3.8–10.8)

## 2016-12-11 MED ORDER — CANAGLIFLOZIN 300 MG PO TABS: 300.0000 mg | ORAL_TABLET | Freq: Every day | ORAL | 3 refills | Status: DC

## 2016-12-11 NOTE — Assessment & Plan Note (Signed)
Limit saturated fats, continue statin; praise given on weight loss

## 2016-12-11 NOTE — Assessment & Plan Note (Signed)
Last renal and liver function reviewed, normal; check at next visit

## 2016-12-11 NOTE — Progress Notes (Signed)
BP 128/82   Pulse 96   Temp 98 F (36.7 C) (Oral)   Resp 16   Wt 243 lb 11.2 oz (110.5 kg)   SpO2 93%   BMI 46.05 kg/m    Subjective:    Patient ID: Ariel Johnston, female    DOB: [birth year], 56 y.o.   MRN: [id number]  HPI: Ariel Johnston is a 56 y.o. female  Chief Complaint  Patient presents with  . Follow-up    3 month    HPI She is here for f/u  HTN: blood pressure well-controlled; has a BP monitor, uses sometimes, usually in the 120's; adding salt to her foods; using Mrs. Dash now, trying to limit  Type 2 diabetes: no dry mouth, no blurred vision; not checking sugars with my blessing; trying to limit sweets, but last week has been tough; just wants candy and cake; has not denied herself; stressed out Lab Results  Component Value Date   HGBA1C 9.8 (H) 08/30/2016   High cholesterol: on statin  Right hip pain: comes and goes; thinks it will be long term issue; will address with stretches on her own; only uses tramadol as needed, just sparingly; on the meloxicam PRN  Obesity: doing Weight Watchers; losing safely and gradually  Elevated WBC: recheck; no s/s of infection  Smoking: about the same; not quite ready to quit; concentrating on the weight loss right now  Depression screen [facility] 2/9 12/11/2016 08/30/2016 04/17/2016 01/17/2016 04/14/2015  Decreased Interest 0 0 0 0 0  Down, Depressed, Hopeless 0 0 0 1 0  PHQ - 2 Score 0 0 0 1 0    Relevant past medical, surgical, family and social history reviewed Past Medical History:  Diagnosis Date  . Depression   . Diabetes mellitus without complication ([place])   . GERD (gastroesophageal reflux disease)   . Hyperlipidemia   . Hypertension   . Sleep apnea    Past Surgical History:  Procedure Laterality Date  . COLONOSCOPY WITH PROPOFOL N/A 12/06/2015   Procedure: COLONOSCOPY WITH PROPOFOL;  Surgeon: Lucilla Lame, MD;  Location: [facility] ENDOSCOPY;  Service: Endoscopy;  Laterality: N/A;  . DILATION AND CURETTAGE  OF UTERUS  2007   Family History  Problem Relation Age of Onset  . Hypertension Brother   . Kidney disease Brother   . Stroke Father   . Hypertension Maternal Grandmother   . Stroke Maternal Grandmother   . Cancer Paternal Grandmother        colon  . Diabetes Paternal Grandfather   . Heart disease Paternal Grandfather   . Hypertension Paternal Grandfather    Social History   Social History  . Marital status: Married    Spouse name: N/A  . Number of children: N/A  . Years of education: N/A   Occupational History  . Not on file.   Social History Main Topics  . Smoking status: Current Every Day Smoker    Packs/day: 0.50    Years: 35.00    Types: Cigarettes  . Smokeless tobacco: Never Used  . Alcohol use No  . Drug use: No  . Sexual activity: Yes   Other Topics Concern  . Not on file   Social History Narrative  . No narrative on file    Interim medical history since last visit reviewed. Allergies and medications reviewed  Review of Systems Per HPI unless specifically indicated above     Objective:    BP 128/82   Pulse 96   Temp 98  F (36.7 C) (Oral)   Resp 16   Wt 243 lb 11.2 oz (110.5 kg)   SpO2 93%   BMI 46.05 kg/m   Wt Readings from Last 3 Encounters:  12/11/16 243 lb 11.2 oz (110.5 kg)  09/19/16 249 lb 14.4 oz (113.4 kg)  08/30/16 251 lb 9.6 oz (114.1 kg)    Physical Exam  Constitutional: She appears well-developed and well-nourished. No distress.  HENT:  Head: Normocephalic and atraumatic.  Eyes: EOM are normal. No scleral icterus.  Neck: No thyromegaly present.  Cardiovascular: Normal rate, regular rhythm and normal heart sounds.   No murmur heard. Pulmonary/Chest: Effort normal and breath sounds normal. No respiratory distress. She has no wheezes.  Abdominal: Soft. Bowel sounds are normal. She exhibits no distension.  Musculoskeletal: Normal range of motion. She exhibits no edema.  Neurological: She is alert. She exhibits normal muscle  tone.  Skin: Skin is warm and dry. She is not diaphoretic. No pallor.  Psychiatric: She has a normal mood and affect. Her mood appears not anxious. She does not exhibit a depressed mood.   Diabetic Foot Form - Detailed   Diabetic Foot Exam - detailed Diabetic Foot exam was performed with the following findings:  Yes 12/11/2016  8:57 AM  Visual Foot Exam completed.:  Yes  Pulse Foot Exam completed.:  Yes  Right Dorsalis Pedis:  Present Left Dorsalis Pedis:  Present  Sensory Foot Exam Completed.:  Yes Semmes-Weinstein Monofilament Test R Site 1-Great Toe:  Pos L Site 1-Great Toe:  Pos       Results for orders placed or performed in visit on 08/30/16  COMPLETE METABOLIC PANEL WITH GFR  Result Value Ref Range   Sodium 137 135 - 146 mmol/L   Potassium 4.3 3.5 - 5.3 mmol/L   Chloride 102 98 - 110 mmol/L   CO2 23 20 - 31 mmol/L   Glucose, Bld 196 (H) 65 - 99 mg/dL   BUN 15 7 - 25 mg/dL   Creat 0.81 0.50 - 1.05 mg/dL   Total Bilirubin 0.5 0.2 - 1.2 mg/dL   Alkaline Phosphatase 62 33 - 130 U/L   AST 21 10 - 35 U/L   ALT 28 6 - 29 U/L   Total Protein 7.8 6.1 - 8.1 g/dL   Albumin 4.2 3.6 - 5.1 g/dL   Calcium 9.7 8.6 - 10.4 mg/dL   GFR, Est African American >89 >=60 mL/min   GFR, Est Non African American 83 >=60 mL/min  Hemoglobin A1c  Result Value Ref Range   Hgb A1c MFr Bld 9.8 (H) <5.7 %   Mean Plasma Glucose 235 mg/dL  Lipid panel  Result Value Ref Range   Cholesterol 176 <200 mg/dL   Triglycerides 141 <150 mg/dL   HDL 50 (L) >50 mg/dL   Total CHOL/HDL Ratio 3.5 <5.0 Ratio   VLDL 28 <30 mg/dL   LDL Cholesterol 98 <100 mg/dL  CBC with Differential/Platelet  Result Value Ref Range   WBC 12.4 (H) 3.8 - 10.8 K/uL   RBC 5.68 (H) 3.80 - 5.10 MIL/uL   Hemoglobin 15.5 11.7 - 15.5 g/dL   HCT 48.5 (H) 35.0 - 45.0 %   MCV 85.4 80.0 - 100.0 fL   MCH 27.3 27.0 - 33.0 pg   MCHC 32.0 32.0 - 36.0 g/dL   RDW 15.5 (H) 11.0 - 15.0 %   Platelets 237 140 - 400 K/uL   MPV 11.2 7.5 - 12.5  fL   Neutro Abs 8,184 (H) 1,500 -  7,800 cells/uL   Lymphs Abs 2,604 850 - 3,900 cells/uL   Monocytes Absolute 1,488 (H) 200 - 950 cells/uL   Eosinophils Absolute 124 15 - 500 cells/uL   Basophils Absolute 0 0 - 200 cells/uL   Neutrophils Relative % 66 %   Lymphocytes Relative 21 %   Monocytes Relative 12 %   Eosinophils Relative 1 %   Basophils Relative 0 %   Smear Review Criteria for review not met       Assessment & Plan:   Problem List Items Addressed This Visit      Cardiovascular and Mediastinum   Essential hypertension    Well-controlled        Endocrine   Diabetes mellitus, type 2 (HCC)    Check A1c today; foot exam by MD; on aspirin, on ARB; eye exam UTD, pt will call for appt when due; limit stress to help curb carb cravings      Relevant Medications   canagliflozin (INVOKANA) 300 MG TABS tablet   Other Relevant Orders   Hemoglobin A1c     Genitourinary   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 benign    She will find a gynecologist to have that followed up; offered referral      Relevant Orders   Ambulatory referral to Gynecology     Other   Tobacco abuse    I am here to help if/when ready to quit      Medication monitoring encounter    Last renal and liver function reviewed, normal; check at next visit      Hyperlipidemia    Limit saturated fats, continue statin; praise given on weight loss      Extreme obesity    Praise given for weight loss      Relevant Medications   canagliflozin (INVOKANA) 300 MG TABS tablet   Elevated white blood cell count, unspecified    Recheck, may be related to pro-inflammatory condition of obesity, but if not, will do further work-up      Relevant Orders   CBC with Differential/Platelet   Chronic pain of right hip - Primary    Offered PT referral; patient will try on her own, may call in the future for referral if desired          Follow up plan: Return in about 3 months  (around 03/13/2017) for twenty minute follow-up with fasting labs.  An after-visit summary was printed and given to the patient at [facility].  Please see the patient instructions which may contain other information and recommendations beyond what is mentioned above in the assessment and plan.  Meds ordered this encounter  Medications  . canagliflozin (INVOKANA) 300 MG TABS tablet    Sig: Take 1 tablet (300 mg total) by mouth daily before breakfast.    Dispense:  90 tablet    Refill:  3    Pt must attend appointment with PCP Dr. Sanda Klein on 12/11/16 for further refills.    Orders Placed This Encounter  Procedures  . CBC with Differential/Platelet  . Hemoglobin A1c  . Ambulatory referral to Gynecology

## 2016-12-11 NOTE — Assessment & Plan Note (Signed)
Recheck, may be related to pro-inflammatory condition of obesity, but if not, will do further work-up

## 2016-12-11 NOTE — Assessment & Plan Note (Signed)
Praise given for weight loss 

## 2016-12-11 NOTE — Assessment & Plan Note (Signed)
Well controlled 

## 2016-12-11 NOTE — Assessment & Plan Note (Signed)
Check A1c today; foot exam by MD; on aspirin, on ARB; eye exam UTD, pt will call for appt when due; limit stress to help curb carb cravings

## 2016-12-11 NOTE — Patient Instructions (Addendum)
We'll have you see the gynecologist for f/u pap smears We'll get labs today Check out the information at familydoctor.org entitled "Nutrition for Weight Loss: What You Need to Know about Fad Diets" Try to lose between 1-2 pounds per week by taking in fewer calories and burning off more calories You can succeed by limiting portions, limiting foods dense in calories and fat, becoming more active, and drinking 8 glasses of water a day (64 ounces) Don't skip meals, especially breakfast, as skipping meals may alter your metabolism Do not use over-the-counter weight loss pills or gimmicks that claim rapid weight loss A healthy BMI (or body mass index) is between 18.5 and 24.9 You can calculate your ideal BMI at the NIH website JobEconomics.hu Try to limit saturated fats in your diet (bologna, hot dogs, barbeque, cheeseburgers, hamburgers, steak, bacon, sausage, cheese, etc.) and get more fresh fruits, vegetables, and whole grains Please do see your eye doctor regularly, and have your eyes examined every year (or more often per his or her recommendation) Check your feet every night and let me know right away of any sores, infections, numbness, etc. Try to limit sweets, white bread, white rice, white potatoes It is okay with me for you to not check your fingerstick blood sugars (per [employer] Best Practices), unless you are interested and feel it would be helpful for you   12 Ways to Curb Anxiety  ?Anxiety is normal human sensation. It is what helped our ancestors survive the pitfalls of the wilderness. Anxiety is defined as experiencing worry or nervousness about an imminent event or something with an uncertain outcome. It is a feeling experienced by most people at some point in their lives. Anxiety can be triggered by a very personal issue, such as the illness of a loved one, or an event of global proportions, such as a refugee  crisis. Some of the symptoms of anxiety are:  Feeling restless.  Having a feeling of impending danger.  Increased heart rate.  Rapid breathing. Sweating.  Shaking.  Weakness or feeling tired.  Difficulty concentrating on anything except the current worry.  Insomnia.  Stomach or bowel problems. What can we do about anxiety we may be feeling? There are many techniques to help manage stress and relax. Here are 12 ways you can reduce your anxiety almost immediately: 1. Turn off the constant feed of information. Take a social media sabbatical. Studies have shown that social media directly contributes to social anxiety.  2. Monitor your television viewing habits. Are you watching shows that are also contributing to your anxiety, such as 24-hour news stations? Try watching something else, or better yet, nothing at all. Instead, listen to music, read an inspirational book or practice a hobby. 3. Eat nutritious meals. Also, don't skip meals and keep healthful snacks on hand. Hunger and poor diet contributes to feeling anxious. 4. Sleep. Sleeping on a regular schedule for at least seven to eight hours a night will do wonders for your outlook when you are awake. 5. Exercise. Regular exercise will help rid your body of that anxious energy and help you get more restful sleep. 6. Try deep (diaphragmatic) breathing. Inhale slowly through your nose for five seconds and exhale through your mouth. 7. Practice acceptance and gratitude. When anxiety hits, accept that there are things out of your control that shouldn't be of immediate concern.  8. Seek out humor. When anxiety strikes, watch a funny video, read jokes or call a friend who makes you  laugh. Laughter is healing for our bodies and releases endorphins that are calming. 9. Stay positive. Take the effort to replace negative thoughts with positive ones. Try to see a stressful situation in a positive light. Try to come up with solutions rather than dwelling on  the problem. 10. Figure out what triggers your anxiety. Keep a journal and make note of anxious moments and the events surrounding them. This will help you identify triggers you can avoid or even eliminate. 11. Talk to someone. Let a trusted friend, family member or even trained professional know that you are feeling overwhelmed and anxious. Verbalize what you are feeling and why.  12. Volunteer. If your anxiety is triggered by a crisis on a large scale, become an advocate and work to resolve the problem that is causing you unease. Anxiety is often unwelcome and can become overwhelming. If not kept in check, it can become a disorder that could require medical treatment. However, if you take the time to care for yourself and avoid the triggers that make you anxious, you will be able to find moments of relaxation and clarity that make your life much more enjoyable.   DASH Eating Plan DASH stands for "Dietary Approaches to Stop Hypertension." The DASH eating plan is a healthy eating plan that has been shown to reduce high blood pressure (hypertension). It may also reduce your risk for type 2 diabetes, heart disease, and stroke. The DASH eating plan may also help with weight loss. What are tips for following this plan? General guidelines  Avoid eating more than 2,300 mg (milligrams) of salt (sodium) a day. If you have hypertension, you may need to reduce your sodium intake to 1,500 mg a day.  Limit alcohol intake to no more than 1 drink a day for nonpregnant women and 2 drinks a day for men. One drink equals 12 oz of beer, 5 oz of wine, or 1 oz of hard liquor.  Work with your health care provider to maintain a healthy body weight or to lose weight. Ask what an ideal weight is for you.  Get at least 30 minutes of exercise that causes your heart to beat faster (aerobic exercise) most days of the week. Activities may include walking, swimming, or biking.  Work with your health care provider or diet and  nutrition specialist (dietitian) to adjust your eating plan to your individual calorie needs. Reading food labels  Check food labels for the amount of sodium per serving. Choose foods with less than 5 percent of the Daily Value of sodium. Generally, foods with less than 300 mg of sodium per serving fit into this eating plan.  To find whole grains, look for the word "whole" as the first word in the ingredient list. Shopping  Buy products labeled as "low-sodium" or "no salt added."  Buy fresh foods. Avoid canned foods and premade or frozen meals. Cooking  Avoid adding salt when cooking. Use salt-free seasonings or herbs instead of table salt or sea salt. Check with your health care provider or pharmacist before using salt substitutes.  Do not fry foods. Cook foods using healthy methods such as baking, boiling, grilling, and broiling instead.  Cook with heart-healthy oils, such as olive, canola, soybean, or sunflower oil. Meal planning   Eat a balanced diet that includes: ? 5 or more servings of fruits and vegetables each day. At each meal, try to fill half of your plate with fruits and vegetables. ? Up to 6-8 servings of whole grains  each day. ? Less than 6 oz of lean meat, poultry, or fish each day. A 3-oz serving of meat is about the same size as a deck of cards. One egg equals 1 oz. ? 2 servings of low-fat dairy each day. ? A serving of nuts, seeds, or beans 5 times each week. ? Heart-healthy fats. Healthy fats called Omega-3 fatty acids are found in foods such as flaxseeds and coldwater fish, like sardines, salmon, and mackerel.  Limit how much you eat of the following: ? Canned or prepackaged foods. ? Food that is high in trans fat, such as fried foods. ? Food that is high in saturated fat, such as fatty meat. ? Sweets, desserts, sugary drinks, and other foods with added sugar. ? Full-fat dairy products.  Do not salt foods before eating.  Try to eat at least 2 vegetarian  meals each week.  Eat more home-cooked food and less restaurant, buffet, and fast food.  When eating at a restaurant, ask that your food be prepared with less salt or no salt, if possible. What foods are recommended? The items listed may not be a complete list. Talk with your dietitian about what dietary choices are best for you. Grains Whole-grain or whole-wheat bread. Whole-grain or whole-wheat pasta. Brown rice. Orpah Cobb. Bulgur. Whole-grain and low-sodium cereals. Pita bread. Low-fat, low-sodium crackers. Whole-wheat flour tortillas. Vegetables Fresh or frozen vegetables (raw, steamed, roasted, or grilled). Low-sodium or reduced-sodium tomato and vegetable juice. Low-sodium or reduced-sodium tomato sauce and tomato paste. Low-sodium or reduced-sodium canned vegetables. Fruits All fresh, dried, or frozen fruit. Canned fruit in natural juice (without added sugar). Meat and other protein foods Skinless chicken or [place]. Ground chicken or [place]. Pork with fat trimmed off. Fish and seafood. Egg whites. Dried beans, peas, or lentils. Unsalted nuts, nut butters, and seeds. Unsalted canned beans. Lean cuts of beef with fat trimmed off. Low-sodium, lean deli meat. Dairy Low-fat (1%) or fat-free (skim) milk. Fat-free, low-fat, or reduced-fat cheeses. Nonfat, low-sodium ricotta or cottage cheese. Low-fat or nonfat yogurt. Low-fat, low-sodium cheese. Fats and oils Soft margarine without trans fats. Vegetable oil. Low-fat, reduced-fat, or light mayonnaise and salad dressings (reduced-sodium). Canola, safflower, olive, soybean, and sunflower oils. Avocado. Seasoning and other foods Herbs. Spices. Seasoning mixes without salt. Unsalted popcorn and pretzels. Fat-free sweets. What foods are not recommended? The items listed may not be a complete list. Talk with your dietitian about what dietary choices are best for you. Grains Baked goods made with fat, such as croissants, muffins, or some  breads. Dry pasta or rice meal packs. Vegetables Creamed or fried vegetables. Vegetables in a cheese sauce. Regular canned vegetables (not low-sodium or reduced-sodium). Regular canned tomato sauce and paste (not low-sodium or reduced-sodium). Regular tomato and vegetable juice (not low-sodium or reduced-sodium). Rosita Fire. Olives. Fruits Canned fruit in a light or heavy syrup. Fried fruit. Fruit in cream or butter sauce. Meat and other protein foods Fatty cuts of meat. Ribs. Fried meat. Tomasa Blase. Sausage. Bologna and other processed lunch meats. Salami. Fatback. Hotdogs. Bratwurst. Salted nuts and seeds. Canned beans with added salt. Canned or smoked fish. Whole eggs or egg yolks. Chicken or [place] with skin. Dairy Whole or 2% milk, cream, and half-and-half. Whole or full-fat cream cheese. Whole-fat or sweetened yogurt. Full-fat cheese. Nondairy creamers. Whipped toppings. Processed cheese and cheese spreads. Fats and oils Butter. Stick margarine. Lard. Shortening. Ghee. Bacon fat. Tropical oils, such as coconut, palm kernel, or palm oil. Seasoning and other foods Salted popcorn and  pretzels. Onion salt, garlic salt, seasoned salt, table salt, and sea salt. Worcestershire sauce. Tartar sauce. Barbecue sauce. Teriyaki sauce. Soy sauce, including reduced-sodium. Steak sauce. Canned and packaged gravies. Fish sauce. Oyster sauce. Cocktail sauce. Horseradish that you find on the shelf. Ketchup. Mustard. Meat flavorings and tenderizers. Bouillon cubes. Hot sauce and Tabasco sauce. Premade or packaged marinades. Premade or packaged taco seasonings. Relishes. Regular salad dressings. Where to find more information:  National Heart, Lung, and Blood Institute: PopSteam.is  American Heart Association: www.heart.org Summary  The DASH eating plan is a healthy eating plan that has been shown to reduce high blood pressure (hypertension). It may also reduce your risk for type 2 diabetes, heart disease, and  stroke.  With the DASH eating plan, you should limit salt (sodium) intake to 2,300 mg a day. If you have hypertension, you may need to reduce your sodium intake to 1,500 mg a day.  When on the DASH eating plan, aim to eat more fresh fruits and vegetables, whole grains, lean proteins, low-fat dairy, and heart-healthy fats.  Work with your health care provider or diet and nutrition specialist (dietitian) to adjust your eating plan to your individual calorie needs. This information is not intended to replace advice given to you by your health care provider. Make sure you discuss any questions you have with your health care provider. Document Released: 05/03/2011 Document Revised: 05/07/2016 Document Reviewed: 05/07/2016 Elsevier Interactive Patient Education  2017 [employer].

## 2016-12-11 NOTE — Assessment & Plan Note (Signed)
Offered PT referral; patient will try on her own, may call in the future for referral if desired

## 2016-12-11 NOTE — Assessment & Plan Note (Signed)
I am here to help if/when ready to quit 

## 2016-12-11 NOTE — Assessment & Plan Note (Signed)
She will find a gynecologist to have that followed up; offered referral

## 2016-12-12 LAB — HEMOGLOBIN A1C
Hgb A1c MFr Bld: 8.2 % — ABNORMAL HIGH (ref <5.7)
Mean Plasma Glucose: 189 mg/dL

## 2016-12-13 ENCOUNTER — Telehealth: Payer: Self-pay | Admitting: Family Medicine

## 2016-12-13 DIAGNOSIS — D582 Other hemoglobinopathies: Secondary | ICD-10-CM | POA: Insufficient documentation

## 2016-12-13 DIAGNOSIS — G4733 Obstructive sleep apnea (adult) (pediatric): Secondary | ICD-10-CM

## 2016-12-13 DIAGNOSIS — R718 Other abnormality of red blood cells: Secondary | ICD-10-CM

## 2016-12-13 NOTE — Telephone Encounter (Signed)
Refer to pulm; RBC, H/H climbing

## 2016-12-13 NOTE — Assessment & Plan Note (Signed)
Refer to pulm; RBC and H/H climbing

## 2016-12-14 ENCOUNTER — Encounter: Payer: Self-pay | Admitting: Family Medicine

## 2016-12-17 ENCOUNTER — Encounter: Payer: Self-pay | Admitting: Family Medicine

## 2017-01-01 ENCOUNTER — Encounter: Payer: Self-pay | Admitting: Obstetrics and Gynecology

## 2017-01-01 ENCOUNTER — Ambulatory Visit (INDEPENDENT_AMBULATORY_CARE_PROVIDER_SITE_OTHER): Payer: BLUE CROSS/BLUE SHIELD | Admitting: Obstetrics and Gynecology

## 2017-01-01 VITALS — BP 133/82 | HR 82 | Ht 61.0 in | Wt 241.8 lb

## 2017-01-01 DIAGNOSIS — Z87898 Personal history of other specified conditions: Secondary | ICD-10-CM

## 2017-01-01 DIAGNOSIS — Z8742 Personal history of other diseases of the female genital tract: Secondary | ICD-10-CM

## 2017-01-01 NOTE — Progress Notes (Signed)
GYNECOLOGY PROGRESS NOTE  Subjective:    Patient ID: Ariel Johnston, female    DOB: 09/26/1960, 56 y.o.   MRN: [id number]  HPI  Patient is a 56 y.o. G0P0000 female who presents as a referral from Dr. Baruch GoutyMelinda Lada for h/o abnormal pap smear.  Patient had an abnormal pap smear in 2012 (ASCUS HR HPV+).  This was then [facility] every 6 months for approximately 1.5 years with all subsequent paps being normal.  Patient notes that she had yearly paps from then on which were normal until ~ 2016 where she was told that she could return to q3 year screening. Patient's last pap smear in 2016 was also normal.    Past Medical History:  Diagnosis Date  . Depression   . Diabetes mellitus without complication (HCC)   . GERD (gastroesophageal reflux disease)   . Hyperlipidemia   . Hypertension   . Sleep apnea     Family History  Problem Relation Age of Onset  . Hypertension Brother   . Kidney disease Brother   . Stroke Father   . Hypertension Maternal Grandmother   . Stroke Maternal Grandmother   . Cancer Paternal Grandmother        colon  . Diabetes Paternal Grandfather   . Heart disease Paternal Grandfather   . Hypertension Paternal Grandfather   . Breast cancer Neg Hx   . Ovarian cancer Neg Hx     Past Surgical History:  Procedure Laterality Date  . COLONOSCOPY WITH PROPOFOL N/A 12/06/2015   Procedure: COLONOSCOPY WITH PROPOFOL;  Surgeon: Midge Miniumarren Wohl, MD;  Location: [facility] ENDOSCOPY;  Service: Endoscopy;  Laterality: N/A;  . DILATION AND CURETTAGE OF UTERUS  2007    Social History   Social History  . Marital status: Married    Spouse name: N/A  . Number of children: N/A  . Years of education: N/A   Occupational History  . Not on file.   Social History Main Topics  . Smoking status: Current Every Day Smoker    Packs/day: 0.50    Years: 35.00    Types: Cigarettes  . Smokeless tobacco: Never Used  . Alcohol use No  . Drug use: No  . Sexual  activity: Yes    Birth control/ protection: Post-menopausal, None   Other Topics Concern  . Not on file   Social History Narrative  . No narrative on file    Current Outpatient Prescriptions on File Prior to Visit  Medication Sig Dispense Refill  . aspirin (ASPIRIN ADULT LOW DOSE) 81 MG EC tablet Take 81 mg by mouth daily.     Marland Kitchen. atorvastatin (LIPITOR) 40 MG tablet TAKE 1 TABLET (40 MG TOTAL) BY MOUTH AT BEDTIME. 90 tablet 1  . canagliflozin (INVOKANA) 300 MG TABS tablet Take 1 tablet (300 mg total) by mouth daily before breakfast. 90 tablet 3  . glucose blood (ONE TOUCH ULTRA TEST) test strip Use as instructed, check FSBS once a day; Dx E11.65, LON 99 months 100 each 3  . JANUMET 50-1000 MG tablet TAKE 1 TABLET BY MOUTH 2 (TWO) TIMES DAILY. 180 tablet 1  . losartan (COZAAR) 100 MG tablet Take 1 tablet (100 mg total) by mouth daily. 90 tablet 3  . meloxicam (MOBIC) 15 MG tablet TAKE 1 TABLET BY MOUTH AS NEEDED FOR PAIN 30 tablet 1  . traMADol (ULTRAM) 50 MG tablet Take 1 tablet (50 mg total) by mouth every 6 (six) hours as needed. 15 tablet  0   No current facility-administered medications on file prior to visit.     Allergies  Allergen Reactions  . Victoza [Liraglutide] Rash    rash      Review of Systems A comprehensive review of systems was negative.   Objective:   Blood pressure 133/82, pulse 82, height 5\' 1"  (1.549 m), weight 241 lb 12.8 oz (109.7 kg). General appearance: alert and no distress Remainder of exam deferred.    Assessment:   History of abnormal pap smear  Plan:   Discussion had with patient that since her abnormal pap smear was ~ 6 years ago and she had been followed with subsequent pap smears (which were normal) since that time up until 2016, that she was no longer requiring high risk screening intervals (q 1 year).  Per ASCCP guidelines, after an abnormal low grade pap smear (ASCUS or LGSIL), she should have a colposcopy performed, and then followed  for 3 subsequent years with a pap smear. If the pap smear remians normal, then she can return to routine (q 3-5 year screening). Her pap smears can now be spaced back to q 3 years, and her next one will be due in 2019.  Patient does note that she has not had a well woman check up since her 2016 pap, so would recommend that she be re-established.  Notes that she has not been seen there since her last GYN retired.  Would like to return to [facility] since all of her previous records and information is there.  Advised patient to contact to schedule an appointment. Her records have been requested from [facility] to view her other pap smears, however we have not yet received hem as of yet.   Can be followed up further at [facility].     A total of 20 minutes were spent face-to-face with the patient during the encounter with greater than 50% dealing with counseling and coordination of care.

## 2017-03-07 ENCOUNTER — Ambulatory Visit (INDEPENDENT_AMBULATORY_CARE_PROVIDER_SITE_OTHER): Payer: BLUE CROSS/BLUE SHIELD | Admitting: Internal Medicine

## 2017-03-07 ENCOUNTER — Encounter: Payer: Self-pay | Admitting: Internal Medicine

## 2017-03-07 VITALS — BP 134/88 | HR 104 | Ht 61.0 in | Wt 247.0 lb

## 2017-03-07 DIAGNOSIS — I10 Essential (primary) hypertension: Secondary | ICD-10-CM | POA: Diagnosis not present

## 2017-03-07 DIAGNOSIS — G4733 Obstructive sleep apnea (adult) (pediatric): Secondary | ICD-10-CM

## 2017-03-07 NOTE — Patient Instructions (Signed)
--  Google "Resmed S9", check your set pressure and AHI and let us know what these are.   --Use your CPAP every night for the entire night or whenever you are sleeping.

## 2017-03-07 NOTE — Progress Notes (Signed)
[facility] [place] [facility] Consultation      Assessment and Plan:  56 yo female with OSA.    Obstructive sleep apnea. -History of obstructive sleep apnea, the patient stopped using CPAP, but is willing to try to restart. Currently, he is experiencing loud snoring, excessive daytime sleepiness. -The patient is asked to restart using CPAP every night for the entire night, she will also check the settings on her machine to let us know what her AHI and her set pressure is. She will call back with this information. -Patient will follow-up in approximately 6 weeks to check her progress, she is not doing better at that time we will consider sending the patient for a new sleep study.  Depression, diabetes mellitus, GERD, essential hypertension. -Obstructive sleep apnea can contribute to above conditions, therefore, it is important to treat sleep apnea in order to obtain ideal control of these conditions.  Date: 03/07/2017  MRN# [id number] Ariel Johnston [birth year]    Ariel Johnston is a 56 y.o. old female seen in consultation for chief complaint of:    Chief Complaint  Patient presents with  . Sleeping Problem    per Dr. Sherie Don: daytime sleepiness: wakes up gasping for air    HPI:   The patient is a 56 year old female with a history of sleep apnea. She is not currently on CPAP. She usually goes to bed between 11 PM and 1 AM. She gets out of bed between 6 AM and 9 AM. Epworth score is 8 today.  She has been diagnosed with OSA in the past, several years ago. She used it every night for initially, and felt that she did well for the first year or 2, then tapered off, and now is not on it at all. Her husband complains that she is snoring loudly and he has been going in the other room, she she has started to use her machine more often. Apparently she is no longer using the machine.  She called her home care company and was told they did not support her machine anymore. She feels  that she is doing well with her machine, she is no longer snoring. She is sleepy during the day but not sure if there is a difference with the machine. She usually get the "happy face" on the machine.  She tells me that currently she is very tired and could lay down and go to sleep, she feels that she has no energy currently.   PMHX:   Past Medical History:  Diagnosis Date  . Depression   . Diabetes mellitus without complication (HCC)   . GERD (gastroesophageal reflux disease)   . Hyperlipidemia   . Hypertension   . Sleep apnea    Surgical Hx:  Past Surgical History:  Procedure Laterality Date  . COLONOSCOPY WITH PROPOFOL N/A 12/06/2015   Procedure: COLONOSCOPY WITH PROPOFOL;  Surgeon: Midge Minium, MD;  Location: [facility] ENDOSCOPY;  Service: Endoscopy;  Laterality: N/A;  . DILATION AND CURETTAGE OF UTERUS  2007   Family Hx:  Family History  Problem Relation Age of Onset  . Hypertension Brother   . Kidney disease Brother   . Stroke Father   . Hypertension Maternal Grandmother   . Stroke Maternal Grandmother   . Cancer Paternal Grandmother        colon  . Diabetes Paternal Grandfather   . Heart disease Paternal Grandfather   . Hypertension Paternal Grandfather   . Breast cancer Neg Hx   . Ovarian cancer  Neg Hx    Social Hx:   Social History  Substance Use Topics  . Smoking status: Current Every Day Smoker    Packs/day: 0.50    Years: 35.00    Types: Cigarettes  . Smokeless tobacco: Never Used  . Alcohol use No   Medication:    Current Outpatient Prescriptions:  .  aspirin (ASPIRIN ADULT LOW DOSE) 81 MG EC tablet, Take 81 mg by mouth daily. , Disp: , Rfl:  .  atorvastatin (LIPITOR) 40 MG tablet, TAKE 1 TABLET (40 MG TOTAL) BY MOUTH AT BEDTIME., Disp: 90 tablet, Rfl: 1 .  canagliflozin (INVOKANA) 300 MG TABS tablet, Take 1 tablet (300 mg total) by mouth daily before breakfast., Disp: 90 tablet, Rfl: 3 .  glucose blood (ONE TOUCH ULTRA TEST) test strip, Use as instructed,  check FSBS once a day; Dx E11.65, LON 99 months, Disp: 100 each, Rfl: 3 .  JANUMET 50-1000 MG tablet, TAKE 1 TABLET BY MOUTH 2 (TWO) TIMES DAILY., Disp: 180 tablet, Rfl: 1 .  losartan (COZAAR) 100 MG tablet, Take 1 tablet (100 mg total) by mouth daily., Disp: 90 tablet, Rfl: 3   Allergies:  Victoza [liraglutide]  Review of Systems: Gen:  Denies  fever, sweats, chills HEENT: Denies blurred vision, double vision. bleeds, sore throat Cvc:  No dizziness, chest pain. Resp:   Denies cough or sputum production, shortness of breath Gi: Denies swallowing difficulty, stomach pain. Gu:  Denies bladder incontinence, burning urine Ext:   No Joint pain, stiffness. Skin: No skin rash,  hives  Endoc:  No polyuria, polydipsia. Psych: No depression, insomnia. Other:  All other systems were reviewed with the patient and were negative other that what is mentioned in the HPI.   Physical Examination:   VS: BP 134/88 (BP Location: Left Arm, Cuff Size: Normal)   Pulse (!) 104   Ht  (1.549 m)   Wt 247 lb (112 kg)   SpO2 95%   BMI 46.67 kg/m   General Appearance: No distress  Neuro:without focal findings,  speech normal,  HEENT: PERRLA, EOM intact.   Pulmonary: normal breath sounds, No wheezing.  CardiovascularNormal S1,S2.  No m/r/g.   Abdomen: Benign, Soft, non-tender. Renal:  No costovertebral tenderness  GU:  No performed at this time. Endoc: No evident thyromegaly, no signs of acromegaly. Skin:   warm, no rashes, no ecchymosis  Extremities: normal, no cyanosis, clubbing.  Other findings:    LABORATORY PANEL:   CBC No results for input(s): WBC, HGB, HCT, PLT in the last 168 hours. ------------------------------------------------------------------------------------------------------------------  Chemistries  No results for input(s): NA, K, CL, CO2, GLUCOSE, BUN, CREATININE, CALCIUM, MG, AST, ALT, ALKPHOS, BILITOT in the last 168 hours.  Invalid input(s):  GFRCGP ------------------------------------------------------------------------------------------------------------------  Cardiac Enzymes No results for input(s): TROPONINI in the last 168 hours. ------------------------------------------------------------  RADIOLOGY:  No results found.     Thank  you for the consultation and for allowing [facility] to assist in the care of your patient. Our recommendations are noted above.  Please contact us if we can be of further service.   Wells Guiles, MD.  Board Certified in Internal Medicine, Pulmonary Medicine, Critical Care Medicine, and Sleep Medicine.  [place] [facility] Office Number: [phone]  Santiago Glad, M.D.  Billy Fischer, M.D  03/07/2017

## 2017-03-20 ENCOUNTER — Encounter: Payer: Self-pay | Admitting: Family Medicine

## 2017-03-20 ENCOUNTER — Ambulatory Visit (INDEPENDENT_AMBULATORY_CARE_PROVIDER_SITE_OTHER): Payer: BLUE CROSS/BLUE SHIELD | Admitting: Family Medicine

## 2017-03-20 VITALS — BP 134/68 | HR 97 | Temp 98.4°F | Ht 61.0 in | Wt 245.0 lb

## 2017-03-20 DIAGNOSIS — Z720 Tobacco use: Secondary | ICD-10-CM

## 2017-03-20 DIAGNOSIS — I10 Essential (primary) hypertension: Secondary | ICD-10-CM

## 2017-03-20 DIAGNOSIS — G4733 Obstructive sleep apnea (adult) (pediatric): Secondary | ICD-10-CM

## 2017-03-20 DIAGNOSIS — L089 Local infection of the skin and subcutaneous tissue, unspecified: Secondary | ICD-10-CM

## 2017-03-20 DIAGNOSIS — E668 Other obesity: Secondary | ICD-10-CM

## 2017-03-20 DIAGNOSIS — E1165 Type 2 diabetes mellitus with hyperglycemia: Secondary | ICD-10-CM

## 2017-03-20 DIAGNOSIS — D582 Other hemoglobinopathies: Secondary | ICD-10-CM

## 2017-03-20 DIAGNOSIS — Z1231 Encounter for screening mammogram for malignant neoplasm of breast: Secondary | ICD-10-CM

## 2017-03-20 DIAGNOSIS — E782 Mixed hyperlipidemia: Secondary | ICD-10-CM

## 2017-03-20 DIAGNOSIS — L723 Sebaceous cyst: Secondary | ICD-10-CM | POA: Diagnosis not present

## 2017-03-20 DIAGNOSIS — Z5181 Encounter for therapeutic drug level monitoring: Secondary | ICD-10-CM | POA: Diagnosis not present

## 2017-03-20 MED ORDER — SULFAMETHOXAZOLE-TRIMETHOPRIM 800-160 MG PO TABS: 1.0000 | ORAL_TABLET | Freq: Two times a day (BID) | ORAL | 0 refills | Status: AC

## 2017-03-20 NOTE — Assessment & Plan Note (Signed)
Likely related to OSA; on machine now so expect number to be coming down

## 2017-03-20 NOTE — Assessment & Plan Note (Signed)
Check A1c; foot exam by MD today 

## 2017-03-20 NOTE — Assessment & Plan Note (Signed)
Not quite to goal; encouraged DASH guidelines, weight loss; time spent problem-solving; avoid salt

## 2017-03-20 NOTE — Assessment & Plan Note (Signed)
Encouraged cessation.

## 2017-03-20 NOTE — Assessment & Plan Note (Signed)
Discussed bariatric surgery and pt not interested in this at this time; discussed barriers, motivation, will start counseling, life coach; see AVS

## 2017-03-20 NOTE — Patient Instructions (Addendum)
I do encourage you to quit smoking Call [phone] to sign up for smoking cessation classes You can call 1-800-QUIT-NOW to talk with a smoking cessation coach  Please start working with a counselor If you need something for aches or pains, try to use Tylenol (acetaminophen) instead of non-steroidals (which include Aleve, ibuprofen, Advil, Motrin, and naproxen); non-steroidals can cause long-term kidney damage   Check out the information at familydoctor.org entitled "Nutrition for Weight Loss: What You Need to Know about Fad Diets" Try to lose between 1-2 pounds per week by taking in fewer calories and burning off more calories You can succeed by limiting portions, limiting foods dense in calories and fat, becoming more active, and drinking 8 glasses of water a day (64 ounces) Don't skip meals, especially breakfast, as skipping meals may alter your metabolism Do not use over-the-counter weight loss pills or gimmicks that claim rapid weight loss A healthy BMI (or body mass index) is between 18.5 and 24.9 You can calculate your ideal BMI at the NIH website JobE[employer].huhttp://www.nhlbi.nih.gov/health/educational/lose_wt/BMI/bmicalc.htm  These are some of the conditions associated with obesity Source: "Caring for Patients after Bariatric Surgery", Meyer RusselAYAZ VIRJI, M.D.,MICHEL Theophilus BonesM. MURR, M.D.,Am Fam Physician. 2006 Apr 15;73(8):1403-1408.  TABLE 1  Obesity-Related Comorbidities System Comorbidities  Cardiovascular Hypertension, hyperlipidemia, cardiomyopathy, long QT syndrome, atrial fibrillation  Pulmonary Reactive airway disease, obstructive sleep apnea, restrictive lung disease  Musculoskeletal Debilitating osteoarthritis, chronic low back pain, immobility  Psychological Binge-eating disorder, depression, body dysmorphic disorder  Dermatologic Intertrigo, venous stasis, decubitus ulcer, acanthosis nigricans  Endocrine Diabetes mellitus, hypoandrogenemia, metabolic syndrome, polycystic ovarian syndrome   Genitourinary Ovarian cancer, uterine cancer, breast cancer, renal cell cancer, dysfunctional uterine bleeding  Gastrointestinal Irritable bowel syndrome, nonalcoholic fatty liver disease, gastroesophageal reflux, esophageal cancer, colon cancer  Neurologic Ischemic stroke, meralgia paresthetica     Obesity, Adult Obesity is having too much body fat. If you have a BMI of 30 or more, you are obese. BMI is a number that explains how much body fat you have. Obesity is often caused by taking in (consuming) more calories than your body uses. Obesity can cause serious health problems. Changing your lifestyle can help to treat obesity. Follow these instructions at home: Eating and drinking   Follow advice from your doctor about what to eat and drink. Your doctor may tell you to: ? Cut down on (limit) fast foods, sweets, and processed snack foods. ? Choose low-fat options. For example, choose low-fat milk instead of whole milk. ? Eat 5 or more servings of fruits or vegetables every day. ? Eat at home more often. This gives you more control over what you eat. ? Choose healthy foods when you eat out. ? Learn what a healthy portion size is. A portion size is the amount of a certain food that is healthy for you to eat at one time. This is different for each person. ? Keep low-fat snacks available. ? Avoid sugary drinks. These include soda, fruit juice, iced tea that is sweetened with sugar, and flavored milk. ? Eat a healthy breakfast.  Drink enough water to keep your pee (urine) clear or pale yellow.  Do not go without eating for long periods of time (do not fast).  Do not go on popular or trendy diets (fad diets). Physical Activity  Exercise often, as told by your doctor. Ask your doctor: ? What types of exercise are safe for you. ? How often you should exercise.  Warm up and stretch before being active.  Do slow stretching  after being active (cool down).  Rest between times of being  active. Lifestyle  Limit how much time you spend in front of your TV, computer, or video game system (be less sedentary).  Find ways to reward yourself that do not involve food.  Limit alcohol intake to no more than 1 drink a day for nonpregnant women and 2 drinks a day for men. One drink equals 12 oz of beer, 5 oz of wine, or 1 oz of hard liquor. General instructions  Keep a weight loss journal. This can help you keep track of: ? The food that you eat. ? The exercise that you do.  Take over-the-counter and prescription medicines only as told by your doctor.  Take vitamins and supplements only as told by your doctor.  Think about joining a support group. Your doctor may be able to help with this.  Keep all follow-up visits as told by your doctor. This is important. Contact a doctor if:  You cannot meet your weight loss goal after you have changed your diet and lifestyle for 6 weeks. This information is not intended to replace advice given to you by your health care provider. Make sure you discuss any questions you have with your health care provider. Document Released: 08/06/2011 Document Revised: 10/20/2015 Document Reviewed: 03/02/2015 Elsevier Interactive Patient Education  2018 [employer].

## 2017-03-20 NOTE — Assessment & Plan Note (Signed)
Check lipids, limit saturated fats 

## 2017-03-20 NOTE — Progress Notes (Signed)
BP 134/68 (BP Location: Left Arm, Patient Position: Sitting, Cuff Size: Large)   Pulse 97   Temp 98.4 F (36.9 C) (Oral)   Ht 5\' 1"  (1.549 m)   Wt 245 lb (111.1 kg)   SpO2 96%   BMI 46.29 kg/m    Subjective:    Patient ID: Ariel Johnston, female    DOB: 08/18/1960, 56 y.o.   MRN: [id number]  HPI: Ariel Johnston is a 56 y.o. female  Chief Complaint  Patient presents with  . Follow-up  . Diabetes    Has not been checking sugars reguarly   . Immunizations    Please review PNA vaccines   . Rash    Bump on back x 4-5 days very painful     HPI   She is here for f/u, but has a new problem today; in the middle of her back; very sore; husband has been putting H2O2 on it; there for several days; no fevers; spot on the sore  Patient has type 2 diabetes mellitus; she has done the classes; not checking sugar  She has high cholesterol; on statin; limiting fatty foods  She has HTN; does add salt to her food; no decongestants  She is morbidly obese; doing Weight Watchers, watching what she eats, then says forget it and eats what she wants to eat; like quicksand; moving forward then backward; thinks she is doing all she can do  She smokes cigarettes; just getting discouraged  Uses tylenol and aleve from time to time; using more aleve lately for knee pain  elevated RBC; referred to pulm for possible OSA; he has her on six weeks of sleep apnea machine and goes back  [facility] 03/20/2017 12/11/2016 08/30/2016 04/17/2016 01/17/2016  Decreased Interest 0 0 0 0 0  Down, Depressed, Hopeless 0 0 0 0 1  PHQ - 2 Score 0 0 0 0 1    Relevant past medical, surgical, family and social history reviewed Past Medical History:  Diagnosis Date  . Depression   . Diabetes mellitus without complication (HCC)   . GERD (gastroesophageal reflux disease)   . Hyperlipidemia   . Hypertension   . Sleep apnea    Past Surgical History:  Procedure Laterality Date  .  COLONOSCOPY WITH PROPOFOL N/A 12/06/2015   Procedure: COLONOSCOPY WITH PROPOFOL;  Surgeon: Midge Miniumarren Wohl, MD;  Location: [facility] ENDOSCOPY;  Service: Endoscopy;  Laterality: N/A;  . DILATION AND CURETTAGE OF UTERUS  2007   Family History  Problem Relation Age of Onset  . Hypertension Brother   . Kidney disease Brother   . Stroke Father   . Hypertension Maternal Grandmother   . Stroke Maternal Grandmother   . Cancer Paternal Grandmother        colon  . Diabetes Paternal Grandfather   . Heart disease Paternal Grandfather   . Hypertension Paternal Grandfather   . Breast cancer Neg Hx   . Ovarian cancer Neg Hx    Social History   Social History  . Marital status: Married    Spouse name: N/A  . Number of children: N/A  . Years of education: N/A   Occupational History  . Not on file.   Social History Main Topics  . Smoking status: Current Every Day Smoker    Packs/day: 0.25    Years: 35.00    Types: Cigarettes  . Smokeless tobacco: Never Used  . Alcohol use No  . Drug use: No  . Sexual activity: Yes  Birth control/ protection: Post-menopausal, None   Other Topics Concern  . Not on file   Social History Narrative  . No narrative on file    Interim medical history since last visit reviewed. Allergies and medications reviewed  Review of Systems Per HPI unless specifically indicated above     Objective:    BP 134/68 (BP Location: Left Arm, Patient Position: Sitting, Cuff Size: Large)   Pulse 97   Temp 98.4 F (36.9 C) (Oral)   Ht 5\' 1"  (1.549 m)   Wt 245 lb (111.1 kg)   SpO2 96%   BMI 46.29 kg/m   Wt Readings from Last 3 Encounters:  03/20/17 245 lb (111.1 kg)  03/07/17 247 lb (112 kg)  01/01/17 241 lb 12.8 oz (109.7 kg)    Physical Exam  Constitutional: She appears well-developed and well-nourished. No distress.  HENT:  Head: Normocephalic and atraumatic.  Eyes: EOM are normal. No scleral icterus.  Neck: No thyromegaly present.  Cardiovascular: Normal  rate, regular rhythm and normal heart sounds.   No murmur heard. Pulmonary/Chest: Effort normal and breath sounds normal. No respiratory distress. She has no wheezes.  Abdominal: Soft. Bowel sounds are normal. She exhibits no distension.  Musculoskeletal: Normal range of motion. She exhibits no edema.  Neurological: She is alert. She exhibits normal muscle tone.  Skin: Skin is warm and dry. She is not diaphoretic. No pallor.     Indurated slightly erythematous lesion on the back under the bra line; single open pore, no active drainage; tender; callus sole of LEFT foot; no erythema, no ulceration  Psychiatric: She has a normal mood and affect. Her mood appears not anxious. She does not exhibit a depressed mood.   Diabetic Foot Form - Detailed   Diabetic Foot Exam - detailed Diabetic Foot exam was performed with the following findings:  Yes 03/20/2017  9:03 AM  Visual Foot Exam completed.:  Yes  Pulse Foot Exam completed.:  Yes  Right Dorsalis Pedis:  Present Left Dorsalis Pedis:  Present  Sensory Foot Exam Completed.:  Yes Semmes-Weinstein Monofilament Test R Site 1-Great Toe:  Pos L Site 1-Great Toe:  Pos        Results for orders placed or performed in visit on 12/14/16  HM PAP SMEAR  Result Value Ref Range   HM Pap smear Normal        Assessment & Plan:   Problem List Items Addressed This Visit      Cardiovascular and Mediastinum   Essential hypertension    Not quite to goal; encouraged DASH guidelines, weight loss; time spent problem-solving; avoid salt        Respiratory   Obstructive apnea    Seeing pulmonologist; on CPAP; check CBC to see if RBC coming down        Endocrine   Diabetes mellitus, type 2 (HCC) - Primary    Check A1c; foot exam by MD today      Relevant Orders   Microalbumin / creatinine urine ratio   Hemoglobin A1c     Other   Tobacco abuse    Encouraged cessation      Medication monitoring encounter    Check liver and kidney  function      Relevant Orders   COMPLETE METABOLIC PANEL WITH GFR   Hyperlipidemia    Check lipids, limit saturated fats      Relevant Orders   Lipid panel   Extreme obesity    Discussed bariatric surgery and pt not  interested in this at this time; discussed barriers, motivation, will start counseling, life coach; see AVS      Relevant Orders   TSH   Elevated hemoglobin (HCC)    Likely related to OSA; on machine now so expect number to be coming down      Relevant Orders   CBC with Differential/Platelet    Other Visit Diagnoses    Encounter for screening mammogram for breast cancer       Relevant Orders   MM DIGITAL SCREENING BILATERAL   Infected sebaceous cyst of skin       Relevant Medications   sulfamethoxazole-trimethoprim (BACTRIM DS) 800-160 MG tablet       Follow up plan: Return in about 3 months (around 06/20/2017) for twenty minute follow-up with fasting labs.  An after-visit summary was printed and given to the patient at check-out.  Please see the patient instructions which may contain other information and recommendations beyond what is mentioned above in the assessment and plan.  Meds ordered this encounter  Medications  . Cholecalciferol (VITAMIN D PO)    Sig: Take by mouth daily.  Marland Kitchen sulfamethoxazole-trimethoprim (BACTRIM DS) 800-160 MG tablet    Sig: Take 1 tablet by mouth 2 (two) times daily.    Dispense:  14 tablet    Refill:  0    Orders Placed This Encounter  Procedures  . MM DIGITAL SCREENING BILATERAL  . Microalbumin / creatinine urine ratio  . Lipid panel  . Hemoglobin A1c  . COMPLETE METABOLIC PANEL WITH GFR  . TSH  . CBC with Differential/Platelet

## 2017-03-20 NOTE — Assessment & Plan Note (Signed)
Check liver and kidney function 

## 2017-03-20 NOTE — Assessment & Plan Note (Signed)
Seeing pulmonologist; on CPAP; check CBC to see if RBC coming down

## 2017-03-21 LAB — TSH: TSH: 0.81 m[IU]/L

## 2017-03-21 LAB — COMPLETE METABOLIC PANEL WITH GFR
AG Ratio: 1.2 (calc) (ref 1.0–2.5)
ALBUMIN MSPROF: 4 g/dL (ref 3.6–5.1)
ALKALINE PHOSPHATASE (APISO): 76 U/L (ref 33–130)
ALT: 20 U/L (ref 6–29)
AST: 17 U/L (ref 10–35)
BUN: 12 mg/dL (ref 7–25)
CO2: 27 mmol/L (ref 20–32)
CREATININE: 0.62 mg/dL (ref 0.50–1.05)
Calcium: 9.1 mg/dL (ref 8.6–10.4)
Chloride: 103 mmol/L (ref 98–110)
GFR, EST AFRICAN AMERICAN: 118 mL/min/{1.73_m2} (ref >=60)
GFR, Est Non African American: 102 mL/min/{1.73_m2} (ref >=60)
GLOBULIN: 3.3 g/dL (ref 1.9–3.7)
Glucose, Bld: 158 mg/dL — ABNORMAL HIGH (ref 65–99)
Potassium: 3.9 mmol/L (ref 3.5–5.3)
SODIUM: 139 mmol/L (ref 135–146)
Total Bilirubin: 0.6 mg/dL (ref 0.2–1.2)
Total Protein: 7.3 g/dL (ref 6.1–8.1)

## 2017-03-21 LAB — CBC WITH DIFFERENTIAL/PLATELET
BASOS ABS: 48 {cells}/uL (ref 0–200)
Basophils Relative: 0.4 %
EOS ABS: 108 {cells}/uL (ref 15–500)
EOS PCT: 0.9 %
HEMATOCRIT: 46.4 % — AB (ref 35.0–45.0)
Hemoglobin: 15.3 g/dL (ref 11.7–15.5)
LYMPHS ABS: 2412 {cells}/uL (ref 850–3900)
MCH: 27.5 pg (ref 27.0–33.0)
MCHC: 33 g/dL (ref 32.0–36.0)
MCV: 83.5 fL (ref 80.0–100.0)
MPV: 11.5 fL (ref 7.5–12.5)
Monocytes Relative: 13 %
Neutro Abs: 7872 cells/uL — ABNORMAL HIGH (ref 1500–7800)
Neutrophils Relative %: 65.6 %
Platelets: 251 10*3/uL (ref 140–400)
RBC: 5.56 10*6/uL — ABNORMAL HIGH (ref 3.80–5.10)
RDW: 13.6 % (ref 11.0–15.0)
Total Lymphocyte: 20.1 %
WBC: 12 10*3/uL — ABNORMAL HIGH (ref 3.8–10.8)
WBCMIX: 1560 {cells}/uL — AB (ref 200–950)

## 2017-03-21 LAB — MICROALBUMIN / CREATININE URINE RATIO
CREATININE, URINE: 81 mg/dL (ref 20–275)
Microalb Creat Ratio: 43 mcg/mg creat — ABNORMAL HIGH (ref <30)
Microalb, Ur: 3.5 mg/dL

## 2017-03-21 LAB — LIPID PANEL
CHOLESTEROL: 146 mg/dL (ref <200)
HDL: 50 mg/dL — ABNORMAL LOW (ref >50)
LDL CHOLESTEROL (CALC): 79 mg/dL
Non-HDL Cholesterol (Calc): 96 mg/dL (calc) (ref <130)
TRIGLYCERIDES: 86 mg/dL (ref <150)
Total CHOL/HDL Ratio: 2.9 (calc) (ref <5.0)

## 2017-03-21 LAB — HEMOGLOBIN A1C
EAG (MMOL/L): 10.3 (calc)
Hgb A1c MFr Bld: 8.1 % of total Hgb — ABNORMAL HIGH (ref <5.7)
MEAN PLASMA GLUCOSE: 186 (calc)

## 2017-03-25 ENCOUNTER — Encounter: Payer: Self-pay | Admitting: Family Medicine

## 2017-03-26 ENCOUNTER — Other Ambulatory Visit: Payer: Self-pay | Admitting: Family Medicine

## 2017-03-26 DIAGNOSIS — D72829 Elevated white blood cell count, unspecified: Secondary | ICD-10-CM

## 2017-03-26 MED ORDER — DAPAGLIFLOZIN PROPANEDIOL 10 MG PO TABS: 10.0000 mg | ORAL_TABLET | Freq: Every day | ORAL | 1 refills | Status: DC

## 2017-03-26 NOTE — Assessment & Plan Note (Signed)
Chronically elevated; refer to heme

## 2017-04-05 ENCOUNTER — Inpatient Hospital Stay: Payer: BLUE CROSS/BLUE SHIELD

## 2017-04-05 ENCOUNTER — Encounter: Payer: Self-pay | Admitting: Family Medicine

## 2017-04-05 ENCOUNTER — Ambulatory Visit: Payer: BLUE CROSS/BLUE SHIELD | Admitting: Family Medicine

## 2017-04-05 ENCOUNTER — Other Ambulatory Visit: Payer: Self-pay

## 2017-04-05 ENCOUNTER — Inpatient Hospital Stay: Payer: BLUE CROSS/BLUE SHIELD | Attending: Oncology | Admitting: Oncology

## 2017-04-05 ENCOUNTER — Encounter: Payer: Self-pay | Admitting: Oncology

## 2017-04-05 VITALS — BP 128/86 | HR 92 | Temp 98.4°F | Resp 18 | Ht 61.0 in | Wt 247.7 lb

## 2017-04-05 VITALS — BP 141/92 | HR 97 | Temp 97.2°F | Resp 18 | Wt 247.0 lb

## 2017-04-05 DIAGNOSIS — L301 Dyshidrosis [pompholyx]: Secondary | ICD-10-CM | POA: Insufficient documentation

## 2017-04-05 DIAGNOSIS — E785 Hyperlipidemia, unspecified: Secondary | ICD-10-CM | POA: Insufficient documentation

## 2017-04-05 DIAGNOSIS — R1310 Dysphagia, unspecified: Secondary | ICD-10-CM | POA: Insufficient documentation

## 2017-04-05 DIAGNOSIS — E669 Obesity, unspecified: Secondary | ICD-10-CM | POA: Insufficient documentation

## 2017-04-05 DIAGNOSIS — F17210 Nicotine dependence, cigarettes, uncomplicated: Secondary | ICD-10-CM | POA: Insufficient documentation

## 2017-04-05 DIAGNOSIS — E7800 Pure hypercholesterolemia, unspecified: Secondary | ICD-10-CM | POA: Diagnosis not present

## 2017-04-05 DIAGNOSIS — K219 Gastro-esophageal reflux disease without esophagitis: Secondary | ICD-10-CM | POA: Diagnosis not present

## 2017-04-05 DIAGNOSIS — D72829 Elevated white blood cell count, unspecified: Secondary | ICD-10-CM | POA: Diagnosis not present

## 2017-04-05 DIAGNOSIS — L089 Local infection of the skin and subcutaneous tissue, unspecified: Secondary | ICD-10-CM

## 2017-04-05 DIAGNOSIS — Z7982 Long term (current) use of aspirin: Secondary | ICD-10-CM | POA: Insufficient documentation

## 2017-04-05 DIAGNOSIS — R5383 Other fatigue: Secondary | ICD-10-CM | POA: Insufficient documentation

## 2017-04-05 DIAGNOSIS — E7801 Familial hypercholesterolemia: Secondary | ICD-10-CM | POA: Insufficient documentation

## 2017-04-05 DIAGNOSIS — D72821 Monocytosis (symptomatic): Secondary | ICD-10-CM | POA: Diagnosis not present

## 2017-04-05 DIAGNOSIS — G4730 Sleep apnea, unspecified: Secondary | ICD-10-CM | POA: Insufficient documentation

## 2017-04-05 DIAGNOSIS — Z79899 Other long term (current) drug therapy: Secondary | ICD-10-CM | POA: Insufficient documentation

## 2017-04-05 DIAGNOSIS — L723 Sebaceous cyst: Secondary | ICD-10-CM | POA: Diagnosis not present

## 2017-04-05 DIAGNOSIS — E119 Type 2 diabetes mellitus without complications: Secondary | ICD-10-CM | POA: Insufficient documentation

## 2017-04-05 DIAGNOSIS — F329 Major depressive disorder, single episode, unspecified: Secondary | ICD-10-CM

## 2017-04-05 DIAGNOSIS — R531 Weakness: Secondary | ICD-10-CM

## 2017-04-05 DIAGNOSIS — E282 Polycystic ovarian syndrome: Secondary | ICD-10-CM | POA: Insufficient documentation

## 2017-04-05 DIAGNOSIS — I10 Essential (primary) hypertension: Secondary | ICD-10-CM | POA: Diagnosis not present

## 2017-04-05 DIAGNOSIS — D729 Disorder of white blood cells, unspecified: Secondary | ICD-10-CM

## 2017-04-05 LAB — CBC WITH DIFFERENTIAL/PLATELET
Basophils Absolute: 0.1 10*3/uL (ref 0–0.1)
Basophils Relative: 1 %
EOS PCT: 1 %
Eosinophils Absolute: 0.1 10*3/uL (ref 0–0.7)
HCT: 46.6 % (ref 35.0–47.0)
Hemoglobin: 15.3 g/dL (ref 12.0–16.0)
LYMPHS ABS: 2.4 10*3/uL (ref 1.0–3.6)
Lymphocytes Relative: 19 %
MCH: 27.9 pg (ref 26.0–34.0)
MCHC: 32.8 g/dL (ref 32.0–36.0)
MCV: 85.1 fL (ref 80.0–100.0)
Monocytes Absolute: 1.3 10*3/uL — ABNORMAL HIGH (ref 0.2–0.9)
Monocytes Relative: 10 %
Neutro Abs: 8.7 10*3/uL — ABNORMAL HIGH (ref 1.4–6.5)
Neutrophils Relative %: 69 %
PLATELETS: 223 10*3/uL (ref 150–440)
RBC: 5.48 MIL/uL — AB (ref 3.80–5.20)
RDW: 15.2 % — AB (ref 11.5–14.5)
WBC: 12.5 10*3/uL — AB (ref 3.6–11.0)

## 2017-04-05 LAB — PATHOLOGIST SMEAR REVIEW

## 2017-04-05 LAB — SEDIMENTATION RATE: Sed Rate: 2 mm/hr (ref 0–30)

## 2017-04-05 NOTE — Patient Instructions (Addendum)
Epidermal Cyst An epidermal cyst is a small, painless lump under your skin. It may be called an epidermal inclusion cyst or an infundibular cyst. The cyst contains a grayish-white, bad-smelling substance (keratin). It is important not to pop epidermal cysts yourself. These cysts are usually harmless (benign), but they can get infected. Symptoms of infection may include:  Redness.  Inflammation.  Tenderness.  Warmth.  Fever.  A grayish-white, bad-smelling substance draining from the cyst.  Pus draining from the cyst.  Follow these instructions at home:  Take over-the-counter and prescription medicines only as told by your doctor.  If you were prescribed an antibiotic, use it as told by your doctor. Do not stop using the antibiotic even if you start to feel better.  Keep the area around your cyst clean and dry.  Wear loose, dry clothing.  Do not try to pop your cyst.  Avoid touching your cyst.  Check your cyst every day for signs of infection.  Keep all follow-up visits as told by your doctor. This is important. How is this prevented?  Wear clean, dry, clothing.  Avoid wearing tight clothing.  Keep your skin clean and dry. Shower or take baths every day.  Wash your body with a benzoyl peroxide wash when you shower or bathe. Contact a health care provider if:  Your cyst has symptoms of infection.  Your condition is not improving or is getting worse.  You have a cyst that looks different from other cysts you have had.  You have a fever. Get help right away if:  Redness spreads from the cyst into the surrounding area. This information is not intended to replace advice given to you by your health care provider. Make sure you discuss any questions you have with your health care provider. Document Released: 06/21/2004 Document Revised: 01/11/2016 Document Reviewed: 03/16/2015 Elsevier Interactive Patient Education  2018 [employer] Inc[employer]  

## 2017-04-05 NOTE — Progress Notes (Signed)
Hematology/Oncology Consult note [facility] Telephone:([phone] [phone]  Patient Care Team: Arnetha Courser, MD as PCP - General (Family Medicine) Bary Castilla Forest Gleason, MD as Consulting Physician (General Surgery)   Name of the patient: Ariel Johnston  [id number]  26-Aug-1960    Reason for referral- leucocytosis   Referring physician- Dr. Sanda Klein  Date of visit: 04/05/17   History of presenting illness- patient is a 56 year old female who has been referred to [place] for leukocytosis.  Her white count has been between 11-12 over the last 1 year.  Recent CBC from 03/20/2017 showed white count of 12, H&H of 15.3/46.4 and a platelet count of 251.  Differential mainly showed neutrophilia with mixed WBC population.  He has had some evidence of persistent monocytosis in the past.  Her past medical history significant for type 2 diabetes, hypertension and high cholesterol. She has been smoking 0.5PPD for many years and is not interested in quitting at this time  ECOG PS- 0  Pain scale- 0   Review of systems- Review of Systems  Constitutional: Positive for malaise/fatigue. Negative for chills, fever and weight loss.  HENT: Negative for congestion, ear discharge and nosebleeds.   Eyes: Negative for blurred vision.  Respiratory: Negative for cough, hemoptysis, sputum production, shortness of breath and wheezing.   Cardiovascular: Negative for chest pain, palpitations, orthopnea and claudication.  Gastrointestinal: Negative for abdominal pain, blood in stool, constipation, diarrhea, heartburn, melena, nausea and vomiting.  Genitourinary: Negative for dysuria, flank pain, frequency, hematuria and urgency.  Musculoskeletal: Negative for back pain, joint pain and myalgias.  Skin: Negative for rash.  Neurological: Negative for dizziness, tingling, focal weakness, seizures, weakness and headaches.  Endo/Heme/Allergies: Does not bruise/bleed easily.    Psychiatric/Behavioral: Negative for depression and suicidal ideas. The patient does not have insomnia.     Allergies  Allergen Reactions  . Victoza [Liraglutide] Rash    rash    Patient Active Problem List   Diagnosis Date Noted  . Elevated red blood cell count 12/13/2016  . Elevated hemoglobin ([place]) 12/13/2016  . Chronic pain of right hip 12/11/2016  . Elevated white blood cell count, unspecified 12/11/2016  . Dysphagia 04/17/2016  . Fatigue 04/17/2016  . Tobacco abuse 01/17/2016  . Medication monitoring encounter 01/17/2016  . Special screening for malignant neoplasms, colon   . Essential hypertension 01/20/2015  . Hyperlipidemia 01/20/2015  . 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 benign 11/03/2014  . Back ache 11/03/2014  . Clinical depression 11/03/2014  . Acid reflux 11/03/2014  . Gravida 0 11/03/2014  . Extreme obesity 11/03/2014  . Obstructive apnea 11/03/2014  . Polycystic ovaries 11/03/2014  . Allergic rhinitis, seasonal 11/03/2014  . Diabetes mellitus, type 2 ([place]) 03/08/2010  . Dyshidrosis 03/08/2010  . Combined fat and carbohydrate induced hyperlipemia 08/18/2008     Past Medical History:  Diagnosis Date  . Depression   . Diabetes mellitus without complication ([place])   . GERD (gastroesophageal reflux disease)   . Hyperlipidemia   . Hypertension   . Sleep apnea      Past Surgical History:  Procedure Laterality Date  . DILATION AND CURETTAGE OF UTERUS  2007    Social History   Socioeconomic History  . Marital status: Married    Spouse name: Not on file  . Number of children: Not on file  . Years of education: Not on file  . Highest education level: Not on file  Social Needs  . Financial resource strain:  Not on file  . Food insecurity - worry: Not on file  . Food insecurity - inability: Not on file  . Transportation needs - medical: Not on file  . Transportation needs - non-medical: Not on file   Occupational History  . Not on file  Tobacco Use  . Smoking status: Current Every Day Smoker    Packs/day: 0.25    Years: 35.00    Pack years: 8.75    Types: Cigarettes  . Smokeless tobacco: Never Used  Substance and Sexual Activity  . Alcohol use: No    Alcohol/week: 0.0 oz  . Drug use: No  . Sexual activity: Yes    Birth control/protection: Post-menopausal, None  Other Topics Concern  . Not on file  Social History Narrative  . Not on file     Family History  Problem Relation Age of Onset  . Hypertension Brother   . Kidney disease Brother   . Stroke Father   . Hypertension Maternal Grandmother   . Stroke Maternal Grandmother   . Cancer Paternal Grandmother        colon  . Diabetes Paternal Grandfather   . Heart disease Paternal Grandfather   . Hypertension Paternal Grandfather   . Breast cancer Neg Hx   . Ovarian cancer Neg Hx      Current Outpatient Medications:  .  aspirin (ASPIRIN ADULT LOW DOSE) 81 MG EC tablet, Take 81 mg by mouth daily. , Disp: , Rfl:  .  atorvastatin (LIPITOR) 40 MG tablet, TAKE 1 TABLET (40 MG TOTAL) BY MOUTH AT BEDTIME., Disp: 90 tablet, Rfl: 1 .  Cholecalciferol (VITAMIN D PO), Take by mouth daily., Disp: , Rfl:  .  glucose blood (ONE TOUCH ULTRA TEST) test strip, Use as instructed, check FSBS once a day; Dx E11.65, LON 99 months, Disp: 100 each, Rfl: 3 .  INVOKANA 300 MG TABS tablet, Take 1 tablet daily by mouth., Disp: , Rfl: 3 .  JANUMET 50-1000 MG tablet, TAKE 1 TABLET BY MOUTH 2 (TWO) TIMES DAILY., Disp: 180 tablet, Rfl: 1 .  losartan (COZAAR) 100 MG tablet, Take 1 tablet (100 mg total) by mouth daily., Disp: 90 tablet, Rfl: 3 .  dapagliflozin propanediol (FARXIGA) 10 MG TABS tablet, Take 10 mg by mouth daily. (this replaces Invokana) (Patient not taking: Reported on 04/05/2017), Disp: 90 tablet, Rfl: 1   Physical exam:  Vitals:   04/05/17 1424 04/05/17 1427  BP:  (!) 141/92  Pulse:  97  Resp:  18  Temp:  (!) 97.2 F (36.2 C)   TempSrc:  Tympanic  Weight: 247 lb (112 kg)    Physical Exam  Constitutional: She is oriented to person, place, and time and well-developed, well-nourished, and in no distress.  HENT:  Head: Normocephalic and atraumatic.  Eyes: EOM are normal. Pupils are equal, round, and reactive to light.  Neck: Normal range of motion.  Cardiovascular: Normal rate, regular rhythm and normal heart sounds.  Pulmonary/Chest: Effort normal and breath sounds normal.  Abdominal: Soft. Bowel sounds are normal.  No palpable splenomegaly  Lymphadenopathy:  No palpable supraclavicular cervical axillary or inguinal adenopathy  Neurological: She is alert and oriented to person, place, and time.  Skin: Skin is warm and dry.       CMP Latest Ref Rng & Units 03/20/2017  Glucose 65 - 99 mg/dL 158(H)  BUN 7 - 25 mg/dL 12  Creatinine 0.50 - 1.05 mg/dL 0.62  Sodium 135 - 146 mmol/L 139  Potassium 3.5 -  5.3 mmol/L 3.9  Chloride 98 - 110 mmol/L 103  CO2 20 - 32 mmol/L 27  Calcium 8.6 - 10.4 mg/dL 9.1  Total Protein 6.1 - 8.1 g/dL 7.3  Total Bilirubin 0.2 - 1.2 mg/dL 0.6  Alkaline Phos 33 - 130 U/L -  AST 10 - 35 U/L 17  ALT 6 - 29 U/L 20   CBC Latest Ref Rng & Units 03/20/2017  WBC 3.8 - 10.8 Thousand/uL 12.0(H)  Hemoglobin 11.7 - 15.5 g/dL 15.3  Hematocrit 35.0 - 45.0 % 46.4(H)  Platelets 140 - 400 Thousand/uL 251    Assessment and plan- Patient is a 56 y.o. female referred for leukocytosis mainly neutrophilia and monocytosis  Both neutrophilia and monocytosis are often nonspecific and could be related to stress, smoking.  I will repeat a CBC with differential, pathology review of smear, peripheral flow cytometry and BCR able testing.  I will see her back in 2 weeks time.  If the results of the above tests are negative then I am inclined to monitor her leukocytosis conservatively without the need for a bone marrow biopsy since her white count has remained stable between 11-12 and she does not have any  other cytopenias or polycythemia or thrombocytosis.   Thank you for this kind referral and the opportunity to participate in the care of this patient   Visit Diagnosis 1. Monocytosis   2. Neutrophilia     Dr. Randa Evens, MD, MPH [facility] Pager- [phone] 04/05/2017  2:46 PM

## 2017-04-05 NOTE — Progress Notes (Signed)
Name: Ariel Johnston   MRN: [id number]    DOB: 03/19/1961   Date:04/05/2017       Progress Note  Subjective  Chief Complaint  Chief Complaint  Patient presents with  . Recurrent Skin Infections    on back for 2 weeks, itching,    HPI  Patient presents to follow up on infected sebaceous cyst   She can still feel discomfort when sitting in certain positions.  She took 5 days worth of Bactrim (was prescribed 7) and this significantly improve.  She had one episodes of serosanguinous drainage about 1 week ago, and has not had any drainage since then.  She denies fevers/chills, fatigue, body aches, NVD.  Patient requests PNA vaccination while she is here today.  She has received both 13 and 23 in 2011 - advised she does not need an additional vaccination until she is 56yo.  Patient Active Problem List   Diagnosis Date Noted  . Elevated red blood cell count 12/13/2016  . Elevated hemoglobin ([facility]) 12/13/2016  . Chronic pain of right hip 12/11/2016  . Elevated white blood cell count, unspecified 12/11/2016  . Dysphagia 04/17/2016  . Fatigue 04/17/2016  . Tobacco abuse 01/17/2016  . Medication monitoring encounter 01/17/2016  . Special screening for malignant neoplasms, colon   . Essential hypertension 01/20/2015  . Hyperlipidemia 01/20/2015  . 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 benign 11/03/2014  . Back ache 11/03/2014  . Clinical depression 11/03/2014  . Acid reflux 11/03/2014  . Gravida 0 11/03/2014  . Extreme obesity 11/03/2014  . Obstructive apnea 11/03/2014  . Polycystic ovaries 11/03/2014  . Allergic rhinitis, seasonal 11/03/2014  . Diabetes mellitus, type 2 ([facility]) 03/08/2010  . Dyshidrosis 03/08/2010  . Combined fat and carbohydrate induced hyperlipemia 08/18/2008    Social History   Tobacco Use  . Smoking status: Current Every Day Smoker    Packs/day: 0.25    Years: 35.00    Pack years: 8.75    Types: Cigarettes  .  Smokeless tobacco: Never Used  Substance Use Topics  . Alcohol use: No    Alcohol/week: 0.0 oz     Current Outpatient Medications:  .  aspirin (ASPIRIN ADULT LOW DOSE) 81 MG EC tablet, Take 81 mg by mouth daily. , Disp: , Rfl:  .  atorvastatin (LIPITOR) 40 MG tablet, TAKE 1 TABLET (40 MG TOTAL) BY MOUTH AT BEDTIME., Disp: 90 tablet, Rfl: 1 .  Cholecalciferol (VITAMIN D PO), Take by mouth daily., Disp: , Rfl:  .  glucose blood (ONE TOUCH ULTRA TEST) test strip, Use as instructed, check FSBS once a day; Dx E11.65, LON 99 months, Disp: 100 each, Rfl: 3 .  JANUMET 50-1000 MG tablet, TAKE 1 TABLET BY MOUTH 2 (TWO) TIMES DAILY., Disp: 180 tablet, Rfl: 1 .  losartan (COZAAR) 100 MG tablet, Take 1 tablet (100 mg total) by mouth daily., Disp: 90 tablet, Rfl: 3 .  dapagliflozin propanediol (FARXIGA) 10 MG TABS tablet, Take 10 mg by mouth daily. (this replaces Invokana) (Patient not taking: Reported on 04/05/2017), Disp: 90 tablet, Rfl: 1  Allergies  Allergen Reactions  . Victoza [Liraglutide] Rash    rash    ROS  Constitutional: Negative for fever or weight change.  Respiratory: Negative for cough and shortness of breath.   Cardiovascular: Negative for chest pain or palpitations.  Gastrointestinal: Negative for abdominal pain, no bowel changes.  Musculoskeletal: Negative for gait problem or joint swelling.  Skin: Negative for rash. Positive for healing infected  cyst. Neurological: Negative for dizziness or headache.  No other specific complaints in a complete review of systems (except as listed in HPI above).  Objective  Vitals:   04/05/17 1313  BP: 128/86  Pulse: 92  Resp: 18  Temp: 98.4 F (36.9 C)  TempSrc: Oral  SpO2: 98%  Weight: 247 lb 11.2 oz (112.4 kg)  Height: 5\' 1"  (1.549 m)   Body mass index is 46.8 kg/m.  Nursing Note and Vital Signs reviewed.  Physical Exam  Constitutional: Patient appears well-developed and well-nourished. Obese. No distress.  HEENT: head  atraumatic, normocephalic Cardiovascular: Normal rate, regular rhythm, S1/S2 present.  No murmur or rub heard. No BLE edema. Pulmonary/Chest: Effort normal and breath sounds clear. No respiratory distress or retractions. Psychiatric: Patient has a normal mood and affect. behavior is normal. Judgment and thought content normal. Skin: Hyperpigmented cystic lesion to central upper back - area is firm, non-tender, and non-erythematous.    Recent Results (from the past 2160 hour(s))  Microalbumin / creatinine urine ratio     Status: Abnormal   Collection Time: 03/20/17  9:00 AM  Result Value Ref Range   Creatinine, Urine 81 20 - 275 mg/dL   Microalb, Ur 3.5 mg/dL    Comment: Reference Range Not established    Microalb Creat Ratio 43 (H) <30 mcg/mg creat    Comment: . The ADA defines abnormalities in albumin excretion as follows: Marland Kitchen Category         Result (mcg/mg creatinine) . Normal                    <30 Microalbuminuria         30-299  Clinical albuminuria   > OR = 300 . The ADA recommends that at least two of three specimens collected within a 3-6 month period be abnormal before considering a patient to be within a diagnostic category.   Lipid panel     Status: Abnormal   Collection Time: 03/20/17  9:00 AM  Result Value Ref Range   Cholesterol 146 <200 mg/dL   HDL 50 (L) >40 mg/dL   Triglycerides 86 <981 mg/dL   LDL Cholesterol (Calc) 79 mg/dL (calc)    Comment: Reference range: <100 . Desirable range <100 mg/dL for primary prevention;   <70 mg/dL for patients with CHD or diabetic patients  with > or = 2 CHD risk factors. Marland Kitchen LDL-C is now calculated using the Martin-Hopkins  calculation, which is a validated novel method providing  better accuracy than the Friedewald equation in the  estimation of LDL-C.  Horald Pollen et al. Lenox Ahr. 1914;782(95): 2061-2068  (http://education.[employer].com/faq/FAQ164)    Total CHOL/HDL Ratio 2.9 <5.0 (calc)   Non-HDL Cholesterol  (Calc) 96 <130 mg/dL (calc)    Comment: For patients with diabetes plus 1 major ASCVD risk  factor, treating to a non-HDL-C goal of <100 mg/dL  (LDL-C of <62 mg/dL) is considered a therapeutic  option.   Hemoglobin A1c     Status: Abnormal   Collection Time: 03/20/17  9:00 AM  Result Value Ref Range   Hgb A1c MFr Bld 8.1 (H) <5.7 % of total Hgb    Comment: For someone without known diabetes, a hemoglobin A1c value of 6.5% or greater indicates that they may have  diabetes and this should be confirmed with a follow-up  test. . For someone with known diabetes, a value <7% indicates  that their diabetes is well controlled and a value  greater than or equal to  7% indicates suboptimal  control. A1c targets should be individualized based on  duration of diabetes, age, comorbid conditions, and  other considerations. . Currently, no consensus exists regarding use of hemoglobin A1c for diagnosis of diabetes for children. .    Mean Plasma Glucose 186 (calc)   eAG (mmol/L) 10.3 (calc)  COMPLETE METABOLIC PANEL WITH GFR     Status: Abnormal   Collection Time: 03/20/17  9:00 AM  Result Value Ref Range   Glucose, Bld 158 (H) 65 - 99 mg/dL    Comment: .            Fasting reference interval . For someone without known diabetes, a glucose value >125 mg/dL indicates that they may have diabetes and this should be confirmed with a follow-up test. .    BUN 12 7 - 25 mg/dL   Creat 4.540.62 0.980.50 - 1.191.05 mg/dL    Comment: For patients >55 years of age, the reference limit for Creatinine is approximately 13% higher for people identified as African-American. .    GFR, Est Non African American 102 > OR = 60 mL/min/1.4473m2   GFR, Est African American 118 > OR = 60 mL/min/1.1573m2   BUN/Creatinine Ratio NOT APPLICABLE 6 - 22 (calc)   Sodium 139 135 - 146 mmol/L   Potassium 3.9 3.5 - 5.3 mmol/L   Chloride 103 98 - 110 mmol/L   CO2 27 20 - 32 mmol/L   Calcium 9.1 8.6 - 10.4 mg/dL   Total Protein 7.3  6.1 - 8.1 g/dL   Albumin 4.0 3.6 - 5.1 g/dL   Globulin 3.3 1.9 - 3.7 g/dL (calc)   AG Ratio 1.2 1.0 - 2.5 (calc)   Total Bilirubin 0.6 0.2 - 1.2 mg/dL   Alkaline phosphatase (APISO) 76 33 - 130 U/L   AST 17 10 - 35 U/L   ALT 20 6 - 29 U/L  TSH     Status: None   Collection Time: 03/20/17  9:00 AM  Result Value Ref Range   TSH 0.81 mIU/L    Comment:           Reference Range .           > or = 20 Years  0.40-4.50 .                Pregnancy Ranges           First trimester    0.26-2.66           Second trimester   0.55-2.73           Third trimester    0.43-2.91   CBC with Differential/Platelet     Status: Abnormal   Collection Time: 03/20/17  9:00 AM  Result Value Ref Range   WBC 12.0 (H) 3.8 - 10.8 Thousand/uL   RBC 5.56 (H) 3.80 - 5.10 Million/uL   Hemoglobin 15.3 11.7 - 15.5 g/dL   HCT 14.746.4 (H) 82.935.0 - 56.245.0 %   MCV 83.5 80.0 - 100.0 fL   MCH 27.5 27.0 - 33.0 pg   MCHC 33.0 32.0 - 36.0 g/dL   RDW 13.013.6 86.511.0 - 78.415.0 %   Platelets 251 140 - 400 Thousand/uL   MPV 11.5 7.5 - 12.5 fL   Neutro Abs 7,872 (H) 1,500 - 7,800 cells/uL   Lymphs Abs 2,412 850 - 3,900 cells/uL   WBC mixed population 1,560 (H) 200 - 950 cells/uL   Eosinophils Absolute 108 15 - 500 cells/uL   Basophils Absolute 48  0 - 200 cells/uL   Neutrophils Relative % 65.6 %   Total Lymphocyte 20.1 %   Monocytes Relative 13.0 %   Eosinophils Relative 0.9 %   Basophils Relative 0.4 %     Assessment & Plan  1. Infected sebaceous cyst of skin - Advised that area does not appear to require I&D at this time. No additional antibiotics required as the area appears to be healing appropriately.  If not improving in 3-4 weeks, we will consider referral to dermatology for further evaluation. Advised to wash with clean soap and water and to keep dry - no topical or oral medications needed. -Return if symptoms worsen or fail to improve, for 3-4 weeks.  -Red flags and when to present for emergency care or RTC including fever  >101.71F,  new/worsening/un-resolving symptoms, reviewed with patient at time of visit. Follow up and care instructions discussed and provided in AVS.

## 2017-04-05 NOTE — Progress Notes (Signed)
Patient here for initial consult of leukocytosis. She states that she was walking up some steps on September 24th, when she fell and hurt her left knee. It is still painful but is healing. She also states that she has a cyst on her back that is healing but still painful as well.

## 2017-04-08 LAB — COMP PANEL: LEUKEMIA/LYMPHOMA

## 2017-04-11 ENCOUNTER — Ambulatory Visit (INDEPENDENT_AMBULATORY_CARE_PROVIDER_SITE_OTHER): Payer: BLUE CROSS/BLUE SHIELD | Admitting: Obstetrics & Gynecology

## 2017-04-11 ENCOUNTER — Encounter: Payer: Self-pay | Admitting: Obstetrics & Gynecology

## 2017-04-11 VITALS — BP 128/80 | HR 92 | Ht 61.0 in | Wt 246.0 lb

## 2017-04-11 DIAGNOSIS — Z0000 Encounter for general adult medical examination without abnormal findings: Secondary | ICD-10-CM

## 2017-04-11 DIAGNOSIS — Z1231 Encounter for screening mammogram for malignant neoplasm of breast: Secondary | ICD-10-CM

## 2017-04-11 DIAGNOSIS — Z1239 Encounter for other screening for malignant neoplasm of breast: Secondary | ICD-10-CM

## 2017-04-11 DIAGNOSIS — Z01419 Encounter for gynecological examination (general) (routine) without abnormal findings: Secondary | ICD-10-CM

## 2017-04-11 DIAGNOSIS — E668 Other obesity: Secondary | ICD-10-CM | POA: Diagnosis not present

## 2017-04-11 LAB — BCR-ABL1, CML/ALL, PCR, QUANT

## 2017-04-11 NOTE — Patient Instructions (Signed)
PAP every three years (2019) Mammogram every year    Call [phone] [facility] Labs yearly (with PCP)

## 2017-04-11 NOTE — Progress Notes (Signed)
HPI:      Ms. Kalman ShanBridgette F Capes is a 56 y.o. G0P0000 who LMP was in the past, she presents today for her annual examination.  The patient has no complaints today. The patient is sexually active. Herlast pap: approximate date 2016 and was normal and last mammogram: approximate date 2017 and was normal.  The patient does perform self breast exams.  There is no notable family history of breast or ovarian cancer in her family. The patient is not taking hormone replacement therapy. Patient denies post-menopausal vaginal bleeding.   The patient has regular exercise: yes. The patient denies current symptoms of depression.    Urinary frequency and urgency, occas LOU.  Denies nocturia.  Occas leakage w cough.  No periods.  Weigth concerns, has started weight watchers.  GYN Hx: Last Colonoscopy:1 year ago. Normal.  Last DEXA: never ago.    PMHx: Past Medical History:  Diagnosis Date  . Depression   . Diabetes mellitus without complication (HCC)   . GERD (gastroesophageal reflux disease)   . Hyperlipidemia   . Hypertension   . Sleep apnea    Past Surgical History:  Procedure Laterality Date  . COLONOSCOPY WITH PROPOFOL N/A 12/06/2015   Procedure: COLONOSCOPY WITH PROPOFOL;  Surgeon: Midge Miniumarren Wohl, MD;  Location: [facility] ENDOSCOPY;  Service: Endoscopy;  Laterality: N/A;  . DILATION AND CURETTAGE OF UTERUS  2007   Family History  Problem Relation Age of Onset  . Hypertension Brother   . Kidney disease Brother   . Stroke Father   . Hypertension Maternal Grandmother   . Stroke Maternal Grandmother   . Cancer Paternal Grandmother        colon  . Diabetes Paternal Grandfather   . Heart disease Paternal Grandfather   . Hypertension Paternal Grandfather   . Breast cancer Neg Hx   . Ovarian cancer Neg Hx    Social History   Tobacco Use  . Smoking status: Current Every Day Smoker    Packs/day: 0.25    Years: 35.00    Pack years: 8.75    Types: Cigarettes  . Smokeless tobacco: Never Used    Substance Use Topics  . Alcohol use: No    Alcohol/week: 0.0 oz  . Drug use: No    Current Outpatient Medications:  .  aspirin (ASPIRIN ADULT LOW DOSE) 81 MG EC tablet, Take 81 mg by mouth daily. , Disp: , Rfl:  .  atorvastatin (LIPITOR) 40 MG tablet, TAKE 1 TABLET (40 MG TOTAL) BY MOUTH AT BEDTIME., Disp: 90 tablet, Rfl: 1 .  Cholecalciferol (VITAMIN D PO), Take by mouth daily., Disp: , Rfl:  .  dapagliflozin propanediol (FARXIGA) 10 MG TABS tablet, Take 10 mg by mouth daily. (this replaces Invokana) (Patient not taking: Reported on 04/05/2017), Disp: 90 tablet, Rfl: 1 .  glucose blood (ONE TOUCH ULTRA TEST) test strip, Use as instructed, check FSBS once a day; Dx E11.65, LON 99 months, Disp: 100 each, Rfl: 3 .  INVOKANA 300 MG TABS tablet, Take 1 tablet daily by mouth., Disp: , Rfl: 3 .  JANUMET 50-1000 MG tablet, TAKE 1 TABLET BY MOUTH 2 (TWO) TIMES DAILY., Disp: 180 tablet, Rfl: 1 .  losartan (COZAAR) 100 MG tablet, Take 1 tablet (100 mg total) by mouth daily., Disp: 90 tablet, Rfl: 3 Allergies: Victoza [liraglutide]  Review of Systems  Constitutional: Negative for chills, fever and malaise/fatigue.  HENT: Negative for congestion, sinus pain and sore throat.   Eyes: Negative for blurred vision and pain.  Respiratory: Negative for cough and wheezing.   Cardiovascular: Negative for chest pain and leg swelling.  Gastrointestinal: Negative for abdominal pain, constipation, diarrhea, heartburn, nausea and vomiting.  Genitourinary: Negative for dysuria, frequency, hematuria and urgency.  Musculoskeletal: Negative for back pain, joint pain, myalgias and neck pain.  Skin: Negative for itching and rash.  Neurological: Negative for dizziness, tremors and weakness.  Endo/Heme/Allergies: Does not bruise/bleed easily.  Psychiatric/Behavioral: Negative for depression. The patient is not nervous/anxious and does not have insomnia.     Objective: BP 128/80   Pulse 92   Ht 5\' 1"  (1.549 m)    Wt 246 lb (111.6 kg)   BMI 46.48 kg/m   Filed Weights   04/11/17 0824  Weight: 246 lb (111.6 kg)   Body mass index is 46.48 kg/m. Physical Exam  Constitutional: She is oriented to person, place, and time. She appears well-developed and well-nourished. No distress.  Genitourinary: Rectum normal, vagina normal and uterus normal. Pelvic exam was performed with patient supine. There is no rash or lesion on the right labia. There is no rash or lesion on the left labia. Vagina exhibits no lesion. No bleeding in the vagina. Right adnexum does not display mass and does not display tenderness. Left adnexum does not display mass and does not display tenderness. Cervix does not exhibit motion tenderness, lesion, friability or polyp.   Uterus is mobile and midaxial. Uterus is not enlarged or exhibiting a mass.  HENT:  Head: Normocephalic and atraumatic. Head is without laceration.  Right Ear: Hearing normal.  Left Ear: Hearing normal.  Nose: No epistaxis.  No foreign bodies.  Mouth/Throat: Uvula is midline, oropharynx is clear and moist and mucous membranes are normal.  Eyes: Pupils are equal, round, and reactive to light.  Neck: Normal range of motion. Neck supple. No thyromegaly present.  Cardiovascular: Normal rate and regular rhythm. Exam reveals no gallop and no friction rub.  No murmur heard. Pulmonary/Chest: Effort normal and breath sounds normal. No respiratory distress. She has no wheezes. Right breast exhibits no mass, no skin change and no tenderness. Left breast exhibits no mass, no skin change and no tenderness.  Abdominal: Soft. Bowel sounds are normal. She exhibits no distension. There is no tenderness. There is no rebound.  Musculoskeletal: Normal range of motion.  Neurological: She is alert and oriented to person, place, and time. No cranial nerve deficit.  Skin: Skin is warm and dry.  Psychiatric: She has a normal mood and affect. Judgment normal.  Vitals reviewed.   Assessment:  Annual Exam 1. Annual physical exam   2. Screening for breast cancer   3. Extreme obesity Chronic    Plan:            1.  Cervical Screening-  Pap smear schedule reviewed with patient  2. Breast screening- Exam annually and mammogram scheduled  3. Colonoscopy every 10 years (UTD), Hemoccult testing after age 55  4. Labs managed by PCP  5. Counseling for hormonal therapy: none, no change in therapy today  6. Monitor OAB as well as GSI sx's; OAB>GSI.  Tx options discussed.  7. Obesity, weight loss discussed. Weight watchers and [employer] exercise program starting soon.     F/U  Return in about 1 year (around 04/11/2018) for Annual.  Annamarie MajorPaul Tamir Wallman, MD, Merlinda FrederickFACOG [facility], [facility] 04/11/2017  9:06 AM

## 2017-04-12 ENCOUNTER — Ambulatory Visit: Payer: BLUE CROSS/BLUE SHIELD | Admitting: Oncology

## 2017-04-15 ENCOUNTER — Ambulatory Visit: Payer: BLUE CROSS/BLUE SHIELD | Admitting: Internal Medicine

## 2017-04-17 ENCOUNTER — Other Ambulatory Visit: Payer: Self-pay

## 2017-04-17 ENCOUNTER — Inpatient Hospital Stay: Payer: BLUE CROSS/BLUE SHIELD | Admitting: Oncology

## 2017-04-17 VITALS — BP 155/96 | HR 89 | Temp 97.3°F | Resp 20 | Wt 247.2 lb

## 2017-04-17 DIAGNOSIS — D72821 Monocytosis (symptomatic): Secondary | ICD-10-CM | POA: Diagnosis not present

## 2017-04-17 DIAGNOSIS — R5383 Other fatigue: Secondary | ICD-10-CM | POA: Diagnosis not present

## 2017-04-17 DIAGNOSIS — D729 Disorder of white blood cells, unspecified: Secondary | ICD-10-CM

## 2017-04-17 DIAGNOSIS — F17210 Nicotine dependence, cigarettes, uncomplicated: Secondary | ICD-10-CM | POA: Diagnosis not present

## 2017-04-17 DIAGNOSIS — K219 Gastro-esophageal reflux disease without esophagitis: Secondary | ICD-10-CM

## 2017-04-17 DIAGNOSIS — E282 Polycystic ovarian syndrome: Secondary | ICD-10-CM

## 2017-04-17 DIAGNOSIS — E785 Hyperlipidemia, unspecified: Secondary | ICD-10-CM | POA: Diagnosis not present

## 2017-04-17 DIAGNOSIS — Z79899 Other long term (current) drug therapy: Secondary | ICD-10-CM

## 2017-04-17 DIAGNOSIS — D72829 Elevated white blood cell count, unspecified: Secondary | ICD-10-CM | POA: Diagnosis not present

## 2017-04-17 DIAGNOSIS — L301 Dyshidrosis [pompholyx]: Secondary | ICD-10-CM

## 2017-04-17 DIAGNOSIS — E7800 Pure hypercholesterolemia, unspecified: Secondary | ICD-10-CM

## 2017-04-17 DIAGNOSIS — E7801 Familial hypercholesterolemia: Secondary | ICD-10-CM

## 2017-04-17 DIAGNOSIS — R531 Weakness: Secondary | ICD-10-CM

## 2017-04-17 DIAGNOSIS — E119 Type 2 diabetes mellitus without complications: Secondary | ICD-10-CM

## 2017-04-17 DIAGNOSIS — F329 Major depressive disorder, single episode, unspecified: Secondary | ICD-10-CM | POA: Diagnosis not present

## 2017-04-17 DIAGNOSIS — E669 Obesity, unspecified: Secondary | ICD-10-CM

## 2017-04-17 DIAGNOSIS — Z7982 Long term (current) use of aspirin: Secondary | ICD-10-CM

## 2017-04-17 DIAGNOSIS — R1310 Dysphagia, unspecified: Secondary | ICD-10-CM

## 2017-04-17 DIAGNOSIS — G4730 Sleep apnea, unspecified: Secondary | ICD-10-CM

## 2017-04-17 DIAGNOSIS — I10 Essential (primary) hypertension: Secondary | ICD-10-CM

## 2017-04-17 NOTE — Progress Notes (Signed)
Hematology/Oncology Consult note [facility]  Telephone:([phone] [phone]  Patient Care Team: Arnetha Courser, MD as PCP - General (Family Medicine) Bary Castilla Forest Gleason, MD as Consulting Physician (General Surgery)   Name of the patient: Ariel Johnston  [id number]  1960-06-22   Date of visit: 04/17/17  Diagnosis- leucocytosis likely reactive  Chief complaint/ Reason for visit- discuss results of bloodwork  Heme/Onc history: Patient is a 56 year old female who has been referred to [place] for leukocytosis.  Her white count has been between 11-12 over the last 1 year.  Recent CBC from 03/20/2017 showed white count of 12, H&H of 15.3/46.4 and a platelet count of 251.  Differential mainly showed neutrophilia with mixed WBC population.  He has had some evidence of persistent monocytosis in the past.  Her past medical history significant for type 2 diabetes, hypertension and high cholesterol. She has been smoking 0.5PPD for many yearrs  Results of blood work from 04/05/2017 were as follows: CBC showed white count of 12.5, H&H of 15.3/46.6 and a platelet count of 223.  Differential mainly showed neutrophilia and monocytosis with an ANC of 8.7.  Review of peripheral smear showed unremarkable RBC WBC and platelet morphology.  Flow cytometry did not reveal any significant immunophenotypic abnormality.  BCR able testing was negative for CML.   Interval history-she is trying to lose weight and would like to quit smoking at some point.  Reports chronic fatigue denies other complaints  ECOG PS- 0 Pain scale- 0  Review of systems- Review of Systems  Constitutional: Negative for chills, fever, malaise/fatigue and weight loss.  HENT: Negative for congestion, ear discharge and nosebleeds.   Eyes: Negative for blurred vision.  Respiratory: Negative for cough, hemoptysis, sputum production, shortness of breath and wheezing.   Cardiovascular: Negative for chest pain,  palpitations, orthopnea and claudication.  Gastrointestinal: Negative for abdominal pain, blood in stool, constipation, diarrhea, heartburn, melena, nausea and vomiting.  Genitourinary: Negative for dysuria, flank pain, frequency, hematuria and urgency.  Musculoskeletal: Negative for back pain, joint pain and myalgias.  Skin: Negative for rash.  Neurological: Negative for dizziness, tingling, focal weakness, seizures, weakness and headaches.  Endo/Heme/Allergies: Does not bruise/bleed easily.  Psychiatric/Behavioral: Negative for depression and suicidal ideas. The patient does not have insomnia.      Allergies  Allergen Reactions  . Victoza [Liraglutide] Rash    rash     Past Medical History:  Diagnosis Date  . Depression   . Diabetes mellitus without complication ([place])   . GERD (gastroesophageal reflux disease)   . Hyperlipidemia   . Hypertension   . Sleep apnea      Past Surgical History:  Procedure Laterality Date  . COLONOSCOPY WITH PROPOFOL N/A 12/06/2015   Procedure: COLONOSCOPY WITH PROPOFOL;  Surgeon: Lucilla Lame, MD;  Location: [facility] ENDOSCOPY;  Service: Endoscopy;  Laterality: N/A;  . DILATION AND CURETTAGE OF UTERUS  2007    Social History   Socioeconomic History  . Marital status: Married    Spouse name: Not on file  . Number of children: Not on file  . Years of education: Not on file  . Highest education level: Not on file  Social Needs  . Financial resource strain: Not on file  . Food insecurity - worry: Not on file  . Food insecurity - inability: Not on file  . Transportation needs - medical: Not on file  . Transportation needs - non-medical: Not on file  Occupational History  . Not  on file  Tobacco Use  . Smoking status: Current Every Day Smoker    Packs/day: 0.25    Years: 35.00    Pack years: 8.75    Types: Cigarettes  . Smokeless tobacco: Never Used  Substance and Sexual Activity  . Alcohol use: No    Alcohol/week: 0.0 oz  . Drug use: No    . Sexual activity: Yes    Birth control/protection: Post-menopausal, None  Other Topics Concern  . Not on file  Social History Narrative  . Not on file    Family History  Problem Relation Age of Onset  . Hypertension Brother   . Kidney disease Brother   . Stroke Father   . Hypertension Maternal Grandmother   . Stroke Maternal Grandmother   . Cancer Paternal Grandmother        colon  . Diabetes Paternal Grandfather   . Heart disease Paternal Grandfather   . Hypertension Paternal Grandfather   . Breast cancer Neg Hx   . Ovarian cancer Neg Hx      Current Outpatient Medications:  .  aspirin (ASPIRIN ADULT LOW DOSE) 81 MG EC tablet, Take 81 mg by mouth daily. , Disp: , Rfl:  .  atorvastatin (LIPITOR) 40 MG tablet, TAKE 1 TABLET (40 MG TOTAL) BY MOUTH AT BEDTIME., Disp: 90 tablet, Rfl: 1 .  Cholecalciferol (VITAMIN D PO), Take by mouth daily., Disp: , Rfl:  .  dapagliflozin propanediol (FARXIGA) 10 MG TABS tablet, Take 10 mg by mouth daily. (this replaces Invokana), Disp: 90 tablet, Rfl: 1 .  glucose blood (ONE TOUCH ULTRA TEST) test strip, Use as instructed, check FSBS once a day; Dx E11.65, LON 99 months, Disp: 100 each, Rfl: 3 .  JANUMET 50-1000 MG tablet, TAKE 1 TABLET BY MOUTH 2 (TWO) TIMES DAILY., Disp: 180 tablet, Rfl: 1 .  losartan (COZAAR) 100 MG tablet, Take 1 tablet (100 mg total) by mouth daily., Disp: 90 tablet, Rfl: 3  Physical exam:  Vitals:   04/17/17 1145  BP: (!) 155/96  Pulse: 89  Resp: 20  Temp: (!) 97.3 F (36.3 C)  TempSrc: Tympanic  Weight: 247 lb 3.2 oz (112.1 kg)   Physical Exam  Constitutional: She is oriented to person, place, and time and well-developed, well-nourished, and in no distress.  HENT:  Head: Normocephalic and atraumatic.  Eyes: EOM are normal. Pupils are equal, round, and reactive to light.  Neck: Normal range of motion.  Cardiovascular: Normal rate, regular rhythm and normal heart sounds.  Pulmonary/Chest: Effort normal and  breath sounds normal.  Abdominal: Soft. Bowel sounds are normal.  Neurological: She is alert and oriented to person, place, and time.  Skin: Skin is warm and dry.     CMP Latest Ref Rng & Units 03/20/2017  Glucose 65 - 99 mg/dL 158(H)  BUN 7 - 25 mg/dL 12  Creatinine 0.50 - 1.05 mg/dL 0.62  Sodium 135 - 146 mmol/L 139  Potassium 3.5 - 5.3 mmol/L 3.9  Chloride 98 - 110 mmol/L 103  CO2 20 - 32 mmol/L 27  Calcium 8.6 - 10.4 mg/dL 9.1  Total Protein 6.1 - 8.1 g/dL 7.3  Total Bilirubin 0.2 - 1.2 mg/dL 0.6  Alkaline Phos 33 - 130 U/L -  AST 10 - 35 U/L 17  ALT 6 - 29 U/L 20   CBC Latest Ref Rng & Units 04/05/2017  WBC 3.6 - 11.0 K/uL 12.5(H)  Hemoglobin 12.0 - 16.0 g/dL 15.3  Hematocrit 35.0 - 47.0 % 46.6  Platelets 150 - 440 K/uL 223      Assessment and plan- Patient is a 56 y.o. 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  Differential is mainly shown neutrophilia and monocytosis.  BCR able testing as well as peripheral flow cytometry was negative for any lymphoma or leukemia.  Bone marrow biopsy is not indicated at this time and her leukocytosis can be followed by her PCP.  If there is a consistently increasing trend in her white count she can be referred to [place] back in the future.  It is very likely that her neutrophilia and monocytosis is reactive secondary to her underlying comorbidities and smoking and does not require hematology follow-up at this time   Visit Diagnosis 1. Neutrophilia   2. Monocytosis      Dr. Randa Evens, MD, MPH [place] at [facility] Pager- [phone] 04/17/2017 12:57 PM

## 2017-04-17 NOTE — Progress Notes (Signed)
Patient here today for results.  

## 2017-05-09 ENCOUNTER — Other Ambulatory Visit: Payer: Self-pay | Admitting: Family Medicine

## 2017-05-09 NOTE — Telephone Encounter (Signed)
Last Cr reviewed 

## 2017-05-13 ENCOUNTER — Ambulatory Visit: Payer: BLUE CROSS/BLUE SHIELD | Admitting: Internal Medicine

## 2017-06-05 ENCOUNTER — Ambulatory Visit
Admission: RE | Admit: 2017-06-05 | Discharge: 2017-06-05 | Disposition: A | Discharge status: Home / Self Care | Payer: BLUE CROSS/BLUE SHIELD | Source: Ambulatory Visit | Attending: Obstetrics & Gynecology | Admitting: Obstetrics & Gynecology

## 2017-06-05 DIAGNOSIS — Z1231 Encounter for screening mammogram for malignant neoplasm of breast: Secondary | ICD-10-CM | POA: Diagnosis not present

## 2017-06-05 DIAGNOSIS — Z1239 Encounter for other screening for malignant neoplasm of breast: Secondary | ICD-10-CM

## 2017-06-19 ENCOUNTER — Ambulatory Visit: Payer: BLUE CROSS/BLUE SHIELD | Admitting: Family Medicine

## 2017-06-19 ENCOUNTER — Encounter: Payer: Self-pay | Admitting: Family Medicine

## 2017-06-19 VITALS — BP 140/82 | HR 105 | Temp 98.0°F | Ht 61.0 in | Wt 243.1 lb

## 2017-06-19 DIAGNOSIS — L84 Corns and callosities: Secondary | ICD-10-CM

## 2017-06-19 DIAGNOSIS — I10 Essential (primary) hypertension: Secondary | ICD-10-CM | POA: Diagnosis not present

## 2017-06-19 DIAGNOSIS — Z720 Tobacco use: Secondary | ICD-10-CM

## 2017-06-19 DIAGNOSIS — L600 Ingrowing nail: Secondary | ICD-10-CM | POA: Diagnosis not present

## 2017-06-19 DIAGNOSIS — E782 Mixed hyperlipidemia: Secondary | ICD-10-CM | POA: Diagnosis not present

## 2017-06-19 DIAGNOSIS — G4733 Obstructive sleep apnea (adult) (pediatric): Secondary | ICD-10-CM

## 2017-06-19 DIAGNOSIS — E1165 Type 2 diabetes mellitus with hyperglycemia: Secondary | ICD-10-CM | POA: Diagnosis not present

## 2017-06-19 MED ORDER — HYDROCHLOROTHIAZIDE 25 MG PO TABS: 12.5000 mg | ORAL_TABLET | Freq: Every day | ORAL | 0 refills | Status: DC

## 2017-06-19 NOTE — Assessment & Plan Note (Signed)
Encouragement given; see AVS 

## 2017-06-19 NOTE — Assessment & Plan Note (Addendum)
Encouraged patient to follow-up with Dr. Nicholos Johnsamachandran; weight loss is her friend; untreated OSA can increase BP and lead to stroke and heart attack

## 2017-06-19 NOTE — Assessment & Plan Note (Signed)
Foot exam by MD; check A1c in 2 weeks; limit sugars and starches

## 2017-06-19 NOTE — Patient Instructions (Addendum)
Try to follow the DASH guidelines (DASH stands for Dietary Approaches to Stop Hypertension). Try to limit the sodium in your diet to no more than 1,500mg  of sodium per day. Certainly try to not exceed 2,000 mg per day at the very most. Do not add salt when cooking or at the table.  Check the sodium amount on labels when shopping, and choose items lower in sodium when given a choice. Avoid or limit foods that already contain a lot of sodium. Eat a diet rich in fruits and vegetables and whole grains, and try to lose weight if overweight or obese  Check out the information at familydoctor.org entitled "Nutrition for Weight Loss: What You Need to Know about Fad Diets" Try to lose between 1-2 pounds per week by taking in fewer calories and burning off more calories You can succeed by limiting portions, limiting foods dense in calories and fat, becoming more active, and drinking 8 glasses of water a day (64 ounces) Don't skip meals, especially breakfast, as skipping meals may alter your metabolism Do not use over-the-counter weight loss pills or gimmicks that claim rapid weight loss A healthy BMI (or body mass index) is between 18.5 and 24.9 You can calculate your ideal BMI at the NIH website JobEconomics.hu  ADD HCTZ 12.5 mg daily (half of a 25 mg tablet) and return to see [place] in 12-14 days for blood pressure recheck and your fasting labs   Preventing Cerebrovascular Disease Arteries are blood vessels that carry blood that contains oxygen from the heart to all parts of the body. Cerebrovascular disease affects arteries that supply the brain. Any condition that blocks or disrupts blood flow to the brain can cause cerebrovascular disease. Brain cells that lose blood supply start to die within minutes (stroke). Stroke is the main danger of cerebrovascular disease. Atherosclerosis and high blood pressure are common causes of cerebrovascular disease.  Atherosclerosis is narrowing and hardening of an artery that results when fat, cholesterol, calcium, or other substances (plaque) build up inside an artery. Plaque reduces blood flow through the artery. High blood pressure increases the risk of bleeding inside the brain. Making diet and lifestyle changes to prevent atherosclerosis and high blood pressure lowers your risk of cerebrovascular disease. What nutrition changes can be made?  Eat more fruits, vegetables, and whole grains.  Reduce how much saturated fat you eat. To do this, eat less red meat and fewer full-fat dairy products.  Eat healthy proteins instead of red meat. Healthy proteins include: ? Fish. Eat fish that contains heart-healthy omega-3 fatty acids, twice a week. Examples include salmon, albacore tuna, mackerel, and herring. ? Chicken. ? Nuts. ? Low-fat or nonfat yogurt.  Avoid processed meats, like bacon and lunchmeat.  Avoid foods that contain: ? A lot of sugar, such as sweets and drinks with added sugar. ? A lot of salt (sodium). Avoid adding extra salt to your food, as told by your health care provider. ? Trans fats, such as margarine and baked goods. Trans fats may be listed as "partially hydrogenated oils" on food labels.  Check food labels to see how much sodium, sugar, and trans fats are in foods.  Use vegetable oils that contain low amounts of saturated fat, such as olive oil or canola oil. What lifestyle changes can be made?  Drink alcohol in moderation. This means no more than 1 drink a day for nonpregnant women and 2 drinks a day for men. One drink equals 12 oz of beer, 5 oz of wine,  or 1 oz of hard liquor.  If you are overweight, ask your health care provider to recommend a weight-loss plan for you. Losing 5-10 lb (2.2-4.5 kg) can reduce your risk of diabetes, atherosclerosis, and high blood pressure.  Exercise for 30?60 minutes on most days, or as much as told by your health care provider. ? Do  moderate-intensity exercise, such as brisk walking, bicycling, and water aerobics. Ask your health care provider which activities are safe for you.  Do not use any products that contain nicotine or tobacco, such as cigarettes and e-cigarettes. If you need help quitting, ask your health care provider. Why are these changes important? Making these changes lowers your risk of many diseases that can cause cerebrovascular disease and stroke. Stroke is a leading cause of death and disability. Making these changes also improves your overall health and quality of life. What can I do to lower my risk? The following factors make you more likely to develop cerebrovascular disease:  Being overweight.  Smoking.  Being physically inactive.  Eating a high-fat diet.  Having certain health conditions, such as: ? Diabetes. ? High blood pressure. ? Heart disease. ? Atherosclerosis. ? High cholesterol. ? Sickle cell disease.  Talk with your health care provider about your risk for cerebrovascular disease. Work with your health care provider to control diseases that you have that may contribute to cerebrovascular disease. Your health care provider may prescribe medicines to help prevent major causes of cerebrovascular disease. Where to find more information: Learn more about preventing cerebrovascular disease from:  National Heart, Lung, and Blood Institute: ClassGossip.plwww.nhlbi.nih.gov/health/health-topics/topics/stroke  Centers for Disease Control and Prevention: http://www.gonzalez-chase.net/cdc.gov/stroke/about.htm  Summary  Cerebrovascular disease can lead to a stroke.  Atherosclerosis and high blood pressure are major causes of cerebrovascular disease.  Making diet and lifestyle changes can reduce your risk of cerebrovascular disease.  Work with your health care provider to get your risk factors under control to reduce your risk of cerebrovascular disease. This information is not intended to replace advice given to you by your  health care provider. Make sure you discuss any questions you have with your health care provider. Document Released: 05/29/2015 Document Revised: 12/02/2015 Document Reviewed: 05/29/2015 Elsevier Interactive Patient Education  2018 Elsevier Inc.  Heart Disease Prevention Heart disease is a leading cause of death. There are many things you can do to help prevent heart disease. Be physically active Physical activity is good for your heart. It helps control your blood pressure, cholesterol levels, and weight. Try to be physically active every day. Ask your health care provider what activities are best for you. Be a healthy weight Extra weight can strain your heart and affect your blood pressure and cholesterol levels. Lose weight with diet and exercise if recommended by your health care provider. Eat heart-healthy foods Follow a healthy eating plan as recommended by your health care provider or dietitian. Heart-healthy foods include:  High-fiber foods. These include oat bran, oatmeal, and whole-grain breads and cereals.  Fruits and vegetables.  Avoid:  Alcohol.  Fried foods.  Foods high in saturated fat. These include meats, butter, whole dairy products, shortening, and coconut or palm oil.  Salty foods. These include canned food, luncheon meat, salty snacks, and fast food.  Keep your cholesterol levels under control Cholesterol is a substance that is used for many important functions. When your cholesterol levels are high, cholesterol can stick to the insides of your blood vessels, making them narrow or clog. This can lead to chest pain (angina)  and a heart attack. Keep your cholesterol levels under control as recommended by your health care provider. Have your cholesterol checked at least once a year. Target cholesterol levels (in mg/dL) for most people are:  Total cholesterol below 200.  LDL cholesterol below 100.  HDL cholesterol above 40 in men and above 50 in  women.  Triglycerides below 150.  Keep your blood pressure under control Having high blood pressure (hypertension) puts you at risk for stroke and other forms of heart disease. Keep your blood pressure under control as recommended by your health care provider. Ask your health care provider if you need treatment to lower your blood pressure. If you are 29-67 years of age, have your blood pressure checked every 3-5 years. If you are 78 years of age or older, have your blood pressure checked every year. Do not use tobacco products Tobacco smoke can damage your heart and blood vessels. Do not use any tobacco products including cigarettes, chewing tobacco, or electronic cigarettes. If you need help quitting, ask your health care provider. Take medicines as directed Take medicines only as directed by your health care provider. Ask your health care provider whether you should take an aspirin every day. Taking aspirin can help reduce your risk of heart disease and stroke. Where to find more information: To find out more about heart disease, visit the American Heart Association's website at www.americanheart.org This information is not intended to replace advice given to you by your health care provider. Make sure you discuss any questions you have with your health care provider. Document Released: 12/27/2003 Document Revised: 10/12/2015 Document Reviewed: 07/08/2013 Elsevier Interactive Patient Education  2018 [employer].  Steps to Quit Smoking Smoking tobacco can be bad for your health. It can also affect almost every organ in your body. Smoking puts you and people around you at risk for many serious long-lasting (chronic) diseases. Quitting smoking is hard, but it is one of the best things that you can do for your health. It is never too late to quit. What are the benefits of quitting smoking? When you quit smoking, you lower your risk for getting serious diseases and conditions. They can  include:  Lung cancer or lung disease.  Heart disease.  Stroke.  Heart attack.  Not being able to have children (infertility).  Weak bones (osteoporosis) and broken bones (fractures).  If you have coughing, wheezing, and shortness of breath, those symptoms may get better when you quit. You may also get sick less often. If you are pregnant, quitting smoking can help to lower your chances of having a baby of low birth weight. What can I do to help me quit smoking? Talk with your doctor about what can help you quit smoking. Some things you can do (strategies) include:  Quitting smoking totally, instead of slowly cutting back how much you smoke over a period of time.  Going to in-person counseling. You are more likely to quit if you go to many counseling sessions.  Using resources and support systems, such as: ? Agricultural engineer with a Veterinary surgeon. ? Phone quitlines. ? Automotive engineer. ? Support groups or group counseling. ? Text messaging programs. ? Mobile phone apps or applications.  Taking medicines. Some of these medicines may have nicotine in them. If you are pregnant or breastfeeding, do not take any medicines to quit smoking unless your doctor says it is okay. Talk with your doctor about counseling or other things that can help you.  Talk with your  doctor about using more than one strategy at the same time, such as taking medicines while you are also going to in-person counseling. This can help make quitting easier. What things can I do to make it easier to quit? Quitting smoking might feel very hard at first, but there is a lot that you can do to make it easier. Take these steps:  Talk to your family and friends. Ask them to support and encourage you.  Call phone quitlines, reach out to support groups, or work with a Veterinary surgeon.  Ask people who smoke to not smoke around you.  Avoid places that make you want (trigger) to smoke, such  as: ? Bars. ? Parties. ? Smoke-break areas at work.  Spend time with people who do not smoke.  Lower the stress in your life. Stress can make you want to smoke. Try these things to help your stress: ? Getting regular exercise. ? Deep-breathing exercises. ? Yoga. ? Meditating. ? Doing a body scan. To do this, close your eyes, focus on one area of your body at a time from head to toe, and notice which parts of your body are tense. Try to relax the muscles in those areas.  Download or buy apps on your mobile phone or tablet that can help you stick to your quit plan. There are many free apps, such as QuitGuide from the [employer] developer for Disease Control and Prevention). You can find more support from smokefree.gov and other websites.  This information is not intended to replace advice given to you by your health care provider. Make sure you discuss any questions you have with your health care provider. Document Released: 03/10/2009 Document Revised: 01/10/2016 Document Reviewed: 09/28/2014 Elsevier Interactive Patient Education  2018 [employer].

## 2017-06-19 NOTE — Assessment & Plan Note (Signed)
Encouraged weigh tloss; she'll do Weight Watchers; hydrated, see AVS

## 2017-06-19 NOTE — Assessment & Plan Note (Signed)
Not to goal; she is working on weight loss; encourged salt avoiding; add 12.5 mg HCTZ and recheck labs in [redacted] weeks along with BP

## 2017-06-19 NOTE — Progress Notes (Signed)
BP 140/82 (BP Location: Left Arm, Patient Position: Sitting, Cuff Size: Large)   Pulse (!) 105   Temp 98 F (36.7 C) (Oral)   Ht 5\' 1"  (1.549 m)   Wt 243 lb 1.6 oz (110.3 kg)   SpO2 96%   BMI 45.93 kg/m    Subjective:    Patient ID: Ariel Johnston, female    DOB: 1960/06/24, 57 y.o.   MRN: [id number]  HPI: Ariel Johnston is a 57 y.o. female  Chief Complaint  Patient presents with  . Follow-up    HPI Patient is here for f/u  HTN; has a machine, not checking regularly; not using; adds salt to her food  Type 2 diabetes; drinks diet sodas; not much sugary drinks, occasionally sweet tea; does eat a lot of bread Lab Results  Component Value Date   HGBA1C 8.1 (H) 03/20/2017   She is working on getting back on track with exercise and thinking about quitting smoking  High cholesterol; on statin; going to start doing more activity; no chest pain or SHOB; holiday time is hard  Morbid obesity; going to make some decision  Smoking; smoking about 1/4 ppd; thinking about quitting  OSA; not using machine; got discouraged; was sent over to place; nothing wrong with machine; they want her to buy a machine and there is nothing wrong with her machine; never followed up with Dr. Nicholos Johns  Depression screen [facility] 2/9 06/19/2017 03/20/2017 12/11/2016 08/30/2016 04/17/2016  Decreased Interest 0 0 0 0 0  Down, Depressed, Hopeless 0 0 0 0 0  PHQ - 2 Score 0 0 0 0 0    Relevant past medical, surgical, family and social history reviewed Past Medical History:  Diagnosis Date  . Depression   . Diabetes mellitus without complication (HCC)   . GERD (gastroesophageal reflux disease)   . Hyperlipidemia   . Hypertension   . Sleep apnea    Past Surgical History:  Procedure Laterality Date  . COLONOSCOPY WITH PROPOFOL N/A 12/06/2015   Procedure: COLONOSCOPY WITH PROPOFOL;  Surgeon: Midge Minium, MD;  Location: [facility] ENDOSCOPY;  Service: Endoscopy;  Laterality: N/A;  . DILATION AND  CURETTAGE OF UTERUS  2007   Family History  Problem Relation Age of Onset  . Hypertension Brother   . Kidney disease Brother   . Stroke Father   . Hypertension Maternal Grandmother   . Stroke Maternal Grandmother   . Cancer Paternal Grandmother        colon  . Diabetes Paternal Grandfather   . Heart disease Paternal Grandfather   . Hypertension Paternal Grandfather   . Breast cancer Neg Hx   . Ovarian cancer Neg Hx    Social History   Tobacco Use  . Smoking status: Current Every Day Smoker    Packs/day: 0.25    Years: 35.00    Pack years: 8.75    Types: Cigarettes  . Smokeless tobacco: Never Used  Substance Use Topics  . Alcohol use: No    Alcohol/week: 0.0 oz  . Drug use: No    Interim medical history since last visit reviewed. Allergies and medications reviewed  Review of Systems Per HPI unless specifically indicated above     Objective:    BP 140/82 (BP Location: Left Arm, Patient Position: Sitting, Cuff Size: Large)   Pulse (!) 105   Temp 98 F (36.7 C) (Oral)   Ht 5\' 1"  (1.549 m)   Wt 243 lb 1.6 oz (110.3 kg)   SpO2 96%  BMI 45.93 kg/m   Wt Readings from Last 3 Encounters:  06/19/17 243 lb 1.6 oz (110.3 kg)  04/17/17 247 lb 3.2 oz (112.1 kg)  04/11/17 246 lb (111.6 kg)    Physical Exam  Constitutional: She appears well-developed and well-nourished. No distress.  HENT:  Head: Normocephalic and atraumatic.  Eyes: EOM are normal. No scleral icterus.  Neck: No thyromegaly present.  Cardiovascular: Normal rate, regular rhythm and normal heart sounds.  No murmur heard. Pulmonary/Chest: Effort normal and breath sounds normal. She has no wheezes.  Abdominal: Soft. Bowel sounds are normal. She exhibits no distension.  Musculoskeletal: Normal range of motion. She exhibits no edema.  Right great toenail cutting in to the medial aspect, no fluctuance, no drainage; nails curved  Neurological: She is alert. She exhibits normal muscle tone.  Skin: Skin is  warm and dry. She is not diaphoretic. No pallor.  Psychiatric: She has a normal mood and affect. Her mood appears not anxious. She does not exhibit a depressed mood.    Results for orders placed or performed in visit on 04/05/17  Sedimentation rate  Result Value Ref Range   Sed Rate 2 0 - 30 mm/hr  BCR-ABL1, CML/ALL, PCR, QUANT  Result Value Ref Range   b2a2 transcript Comment %   b3a2 transcript Comment %   E1A2 Transcript Comment %   Interpretation (BCRAL): Comment    Director Review [facility](BCRAL): Comment    Background: Comment    Methodology Comment   Flow cytometry panel-leukemia/lymphoma work-up  Result Value Ref Range   PATH INTERP XXX-IMP Comment    ANNOTATION COMMENT IMP Comment    CLINICAL INFO Comment    Misc Source Comment    ASSESSMENT OF LEUKOCYTES Comment    % Viable Cells Comment    ANALYSIS AND GATING STRATEGY Comment    IMMUNOPHENOTYPING STUDY Comment    PATHOLOGIST NAME Comment    COMMENT: Comment   Pathologist smear review  Result Value Ref Range   Path Review      Peripheral blood smear reviewed upon clinician request. Patient with history of mild leukocytosis.  CBC with Differential  Result Value Ref Range   WBC 12.5 (H) 3.6 - 11.0 K/uL   RBC 5.48 (H) 3.80 - 5.20 MIL/uL   Hemoglobin 15.3 12.0 - 16.0 g/dL   HCT 16.146.6 09.635.0 - 04.547.0 %   MCV 85.1 80.0 - 100.0 fL   MCH 27.9 26.0 - 34.0 pg   MCHC 32.8 32.0 - 36.0 g/dL   RDW 40.915.2 (H) 81.111.5 - 91.414.5 %   Platelets 223 150 - 440 K/uL   Neutrophils Relative % 69 %   Neutro Abs 8.7 (H) 1.4 - 6.5 K/uL   Lymphocytes Relative 19 %   Lymphs Abs 2.4 1.0 - 3.6 K/uL   Monocytes Relative 10 %   Monocytes Absolute 1.3 (H) 0.2 - 0.9 K/uL   Eosinophils Relative 1 %   Eosinophils Absolute 0.1 0 - 0.7 K/uL   Basophils Relative 1 %   Basophils Absolute 0.1 0 - 0.1 K/uL      Assessment & Plan:   Problem List Items Addressed This Visit      Cardiovascular and Mediastinum   Essential hypertension    Not to goal; she is  working on weight loss; encourged salt avoiding; add 12.5 mg HCTZ and recheck labs in [redacted] weeks along with BP      Relevant Medications   hydrochlorothiazide (HYDRODIURIL) 25 MG tablet     Respiratory  Obstructive apnea    Encouraged patient to follow-up with Dr. Nicholos Johns; weight loss is her friend; untreated OSA can increase BP and lead to stroke and heart attack        Endocrine   Diabetes mellitus, type 2 (HCC) - Primary    Foot exam by MD; check A1c in 2 weeks; limit sugars and starches      Relevant Orders   Ambulatory referral to Podiatry     Other   Tobacco abuse    Encouragement given; see AVS      Morbid obesity (HCC)    Encouraged weigh tloss; she'll do Weight Watchers; hydrated, see AVS      Hyperlipidemia    Check lipids in 2 weeks, limit cow and pig food; continue the statin      Relevant Medications   hydrochlorothiazide (HYDRODIURIL) 25 MG tablet    Other Visit Diagnoses    Ingrown right big toenail       Relevant Orders   Ambulatory referral to Podiatry   Plantar callus       Relevant Orders   Ambulatory referral to Podiatry       Follow up plan: Return in about 12 days (around 07/01/2017) for blood pressure recheck with CMA and fasting labs; 3-1/2 months with Dr. Sherie Don.  An after-visit summary was printed and given to the patient at check-out.  Please see the patient instructions which may contain other information and recommendations beyond what is mentioned above in the assessment and plan.  Meds ordered this encounter  Medications  . hydrochlorothiazide (HYDRODIURIL) 25 MG tablet    Sig: Take 0.5 tablets (12.5 mg total) by mouth daily.    Dispense:  15 tablet    Refill:  0    Orders Placed This Encounter  Procedures  . Ambulatory referral to Podiatry

## 2017-06-19 NOTE — Assessment & Plan Note (Signed)
Check lipids in 2 weeks, limit cow and pig food; continue the statin

## 2017-07-01 ENCOUNTER — Ambulatory Visit: Payer: BLUE CROSS/BLUE SHIELD

## 2017-07-01 VITALS — BP 122/78

## 2017-07-01 DIAGNOSIS — E1165 Type 2 diabetes mellitus with hyperglycemia: Secondary | ICD-10-CM

## 2017-07-01 DIAGNOSIS — Z5181 Encounter for therapeutic drug level monitoring: Secondary | ICD-10-CM

## 2017-07-01 DIAGNOSIS — E782 Mixed hyperlipidemia: Secondary | ICD-10-CM

## 2017-07-02 ENCOUNTER — Other Ambulatory Visit: Payer: Self-pay | Admitting: Family Medicine

## 2017-07-02 LAB — LIPID PANEL
Cholesterol: 131 mg/dL (ref <200)
HDL: 51 mg/dL (ref >50)
LDL CHOLESTEROL (CALC): 61 mg/dL
NON-HDL CHOLESTEROL (CALC): 80 mg/dL (ref <130)
TRIGLYCERIDES: 105 mg/dL (ref <150)
Total CHOL/HDL Ratio: 2.6 (calc) (ref <5.0)

## 2017-07-02 LAB — COMPLETE METABOLIC PANEL WITH GFR
AG Ratio: 1.2 (calc) (ref 1.0–2.5)
ALKALINE PHOSPHATASE (APISO): 71 U/L (ref 33–130)
ALT: 19 U/L (ref 6–29)
AST: 15 U/L (ref 10–35)
Albumin: 4.3 g/dL (ref 3.6–5.1)
BILIRUBIN TOTAL: 0.3 mg/dL (ref 0.2–1.2)
BUN: 15 mg/dL (ref 7–25)
CHLORIDE: 101 mmol/L (ref 98–110)
CO2: 26 mmol/L (ref 20–32)
Calcium: 10.3 mg/dL (ref 8.6–10.4)
Creat: 0.8 mg/dL (ref 0.50–1.05)
GFR, Est African American: 96 mL/min/{1.73_m2} (ref >=60)
GFR, Est Non African American: 83 mL/min/{1.73_m2} (ref >=60)
GLUCOSE: 195 mg/dL — AB (ref 65–99)
Globulin: 3.6 g/dL (calc) (ref 1.9–3.7)
Potassium: 4.2 mmol/L (ref 3.5–5.3)
Sodium: 138 mmol/L (ref 135–146)
Total Protein: 7.9 g/dL (ref 6.1–8.1)

## 2017-07-02 LAB — HEMOGLOBIN A1C
EAG (MMOL/L): 11.4 (calc)
Hgb A1c MFr Bld: 8.8 % of total Hgb — ABNORMAL HIGH (ref <5.7)
MEAN PLASMA GLUCOSE: 206 (calc)

## 2017-07-02 MED ORDER — PIOGLITAZONE HCL 15 MG PO TABS: 15.0000 mg | ORAL_TABLET | Freq: Every day | ORAL | 0 refills | Status: DC

## 2017-07-02 NOTE — Progress Notes (Signed)
Start 15 mg Actos, increase if not bringing glucose down or add insulin or injectable GLP-1 in place of the Januvia

## 2017-07-05 ENCOUNTER — Other Ambulatory Visit: Payer: Self-pay | Admitting: Family Medicine

## 2017-07-14 NOTE — Progress Notes (Signed)
Closing out scanned document note open since  July 2018 Already wet signed

## 2017-07-14 NOTE — Progress Notes (Signed)
Signing off on abstraction 

## 2017-07-15 ENCOUNTER — Ambulatory Visit: Payer: BLUE CROSS/BLUE SHIELD | Admitting: Podiatry

## 2017-07-15 ENCOUNTER — Encounter: Payer: Self-pay | Admitting: Podiatry

## 2017-07-15 VITALS — BP 112/74 | HR 98

## 2017-07-15 DIAGNOSIS — Q828 Other specified congenital malformations of skin: Secondary | ICD-10-CM | POA: Diagnosis not present

## 2017-07-15 DIAGNOSIS — E119 Type 2 diabetes mellitus without complications: Secondary | ICD-10-CM | POA: Diagnosis not present

## 2017-07-15 NOTE — Progress Notes (Signed)
   Subjective:    Patient ID: Ariel Johnston, female    DOB: 03/10/1961, 57 y.o.   MRN: [id number]  HPI this patient presents the office for an evaluation of a callus noted on her left forefoot.  She was referred to this office by her medical doctor who desired to have this skin lesion evaluated.  She says she has no pain or discomfort and trims the callus herself.  She denies any injury such as stepping on an needle to cause this lesion to occur.  She denies any drainage or pus from this site.  She presents the office today for an evaluation and treatment of this. callused skin lesion.    Review of Systems  All other systems reviewed and are negative.      Objective:   Physical Exam General Appearance  Alert, conversant and in no acute stress.  Vascular  Dorsalis pedis and posterior pulses are palpable  bilaterally.  Capillary return is within normal limits  bilaterally. Temperature is within normal limits  Bilaterally.  Neurologic  Senn-Weinstein monofilament wire test within normal limits  bilaterally. Muscle power within normal limits bilaterally.  Nails Pincer toenails  Hallux  B/L  Orthopedic  No limitations of motion of motion feet bilaterally.  No crepitus or effusions noted.  No bony pathology or digital deformities noted.  Skin  normotropic skin with no porokeratosis noted bilaterally.  No signs of infections or ulcers noted.  Porokeratosis sub 2 left foot.        Assessment & Plan:  Porokeratosis  Sub 2 left.  Diabetes with no foot complications   IE  Debridement of callus under the left forefoot reveals a circular callus lesion resembling a porokeratosis.  No evidence of any redness or fluctuance noted at this site.  Mild pincer nails noted on the hallux bilateral.  Patient is also concerned about her heels and recommended over-the-counter medicine to apply to her heels.  Return to the clinic when necessary.  Told the patient that the pro-kses can be tovided as  needed. Or we can  consider removal and excision of the skin lesion.   Helane GuntherGregory Mayer DPM

## 2017-07-16 ENCOUNTER — Other Ambulatory Visit: Payer: Self-pay | Admitting: Family Medicine

## 2017-07-16 NOTE — Telephone Encounter (Signed)
Reviewed K+ and Cr 07/01/17; Rx approved

## 2017-08-01 ENCOUNTER — Other Ambulatory Visit: Payer: Self-pay | Admitting: Family Medicine

## 2017-09-24 ENCOUNTER — Other Ambulatory Visit: Payer: Self-pay | Admitting: Family Medicine

## 2017-09-29 ENCOUNTER — Other Ambulatory Visit: Payer: Self-pay | Admitting: Family Medicine

## 2017-09-29 NOTE — Telephone Encounter (Signed)
I expect to increase the farxiga when she comes in this week I had asked her to let me know what her sugars were running so we could adjust medicine, but I don't see a note that she contacted [place] with [employer]

## 2017-10-02 ENCOUNTER — Encounter: Payer: Self-pay | Admitting: Family Medicine

## 2017-10-02 ENCOUNTER — Ambulatory Visit: Payer: BLUE CROSS/BLUE SHIELD | Admitting: Family Medicine

## 2017-10-02 VITALS — BP 118/78 | HR 92 | Temp 98.1°F | Resp 16 | Ht 61.0 in | Wt 248.1 lb

## 2017-10-02 DIAGNOSIS — E1165 Type 2 diabetes mellitus with hyperglycemia: Secondary | ICD-10-CM

## 2017-10-02 DIAGNOSIS — Z5181 Encounter for therapeutic drug level monitoring: Secondary | ICD-10-CM

## 2017-10-02 DIAGNOSIS — E782 Mixed hyperlipidemia: Secondary | ICD-10-CM | POA: Diagnosis not present

## 2017-10-02 NOTE — Assessment & Plan Note (Signed)
Consider calorie counter; encouraged hydration; showed her the amount of fat in mayonnaise and salad dressing

## 2017-10-02 NOTE — Patient Instructions (Addendum)
Do not use regular salad dressing or add cheese or bacon bits or croutons to your salads Use fat free salad dressing and just veggies Add welchol, taper up by taking just one pill with a meal once a day for the first week, then go to twice a day; if tolerating it, then call me at the end of the month and we'll increase further Check out the information at familydoctor.org entitled "Nutrition for Weight Loss: What You Need to Know about Fad Diets" Try to lose between 1-2 pounds per week by taking in fewer calories and burning off more calories You can succeed by limiting portions, limiting foods dense in calories and fat, becoming more active, and drinking 8 glasses of water a day (64 ounces) Don't skip meals, especially breakfast, as skipping meals may alter your metabolism Do not use over-the-counter weight loss pills or gimmicks that claim rapid weight loss A healthy BMI (or body mass index) is between 18.5 and 24.9 You can calculate your ideal BMI at the NIH website JobEconomics.hu Let's get labs today Take vitamin B12 500 mcg sublingual every afternoon to boost your energy Preventing Unhealthy Weight Gain, Adult Staying at a healthy weight is important. When fat builds up in your body, you may become overweight or obese. These conditions put you at greater risk for developing certain health problems, such as heart disease, diabetes, sleeping problems, joint problems, and some cancers. Unhealthy weight gain is often the result of making unhealthy choices in what you eat. It is also a result of not getting enough exercise. You can make changes to your lifestyle to prevent obesity and stay as healthy as possible. What nutrition changes can be made? To maintain a healthy weight and prevent obesity:  Eat only as much as your body needs. To do this: ? Pay attention to signs that you are hungry or full. Stop eating as soon as you feel full. ? If  you feel hungry, try drinking water first. Drink enough water so your urine is clear or pale yellow. ? Eat smaller portions. ? Look at serving sizes on food labels. Most foods contain more than one serving per container. ? Eat the recommended amount of calories for your gender and activity level. While most active people should eat around 2,000 calories per day, if you are trying to lose weight or are not very active, you main need to eat less calories. Talk to your health care provider or dietitian about how many calories you should eat each day.  Choose healthy foods, such as: ? Fruits and vegetables. Try to fill at least half of your plate at each meal with fruits and vegetables. ? Whole grains, such as whole wheat bread, brown rice, and quinoa. ? Lean meats, such as chicken or fish. ? Other healthy proteins, such as beans, eggs, or tofu. ? Healthy fats, such as nuts, seeds, fatty fish, and olive oil. ? Low-fat or fat-free dairy.  Check food labels and avoid food and drinks that: ? Are high in calories. ? Have added sugar. ? Are high in sodium. ? Have saturated fats or trans fats.  Limit how much you eat of the following foods: ? Prepackaged meals. ? Fast food. ? Fried foods. ? Processed meat, such as bacon, sausage, and deli meats. ? Fatty cuts of red meat and poultry with skin.  Cook foods in healthier ways, such as by baking, broiling, or grilling.  When grocery shopping, try to shop around the outside of the store.  This helps you buy mostly fresh foods and avoid canned and prepackaged foods.  What lifestyle changes can be made?  Exercise at least 30 minutes 5 or more days each week. Exercising includes brisk walking, yard work, biking, running, swimming, and team sports like basketball and soccer. Ask your health care provider which exercises are safe for you.  Do not use any products that contain nicotine or tobacco, such as cigarettes and e-cigarettes. If you need help  quitting, ask your health care provider.  Limit alcohol intake to no more than 1 drink a day for nonpregnant women and 2 drinks a day for men. One drink equals 12 oz of beer, 5 oz of wine, or 1 oz of hard liquor.  Try to get 7-9 hours of sleep each night. What other changes can be made?  Keep a food and activity journal to keep track of: ? What you ate and how many calories you had. Remember to count sauces, dressings, and side dishes. ? Whether you were active, and what exercises you did. ? Your calorie, weight, and activity goals.  Check your weight regularly. Track any changes. If you notice you have gained weight, make changes to your diet or activity routine.  Avoid taking weight-loss medicines or supplements. Talk to your health care provider before starting any new medicine or supplement.  Talk to your health care provider before trying any new diet or exercise plan. Why are these changes important? Eating healthy, staying active, and having healthy habits not only help prevent obesity, they also:  Help you to manage stress and emotions.  Help you to connect with friends and family.  Improve your self-esteem.  Improve your sleep.  Prevent long-term health problems.  What can happen if changes are not made? Being obese or overweight can cause you to develop joint or bone problems, which can make it hard for you to stay active or do activities you enjoy. Being obese or overweight also puts stress on your heart and lungs and can lead to health problems like diabetes, heart disease, and some cancers. Where to find more information: Talk with your health care provider or a dietitian about healthy eating and healthy lifestyle choices. You may also find other information through these resources:  U.S. Department of Agriculture MyPlate: https://ball-collins.biz/  American Heart Association: www.heart.org  Centers for Disease Control and Prevention: FootballExhibition.com.br  Summary  Staying  at a healthy weight is important. It helps prevent certain diseases and health problems, such as heart disease, diabetes, joint problems, sleep disorders, and some cancers.  Being obese or overweight can cause you to develop joint or bone problems, which can make it hard for you to stay active or do activities you enjoy.  You can prevent unhealthy weight gain by eating a healthy diet, exercising regularly, not smoking, limiting alcohol, and getting enough sleep.  Talk with your health care provider or a dietitian for guidance about healthy eating and healthy lifestyle choices. This information is not intended to replace advice given to you by your health care provider. Make sure you discuss any questions you have with your health care provider. Document Released: 05/15/2016 Document Revised: 06/20/2016 Document Reviewed: 06/20/2016 Elsevier Interactive Patient Education  2018 [employer].  Obesity, Adult Obesity is the condition of having too much total body fat. Being overweight or obese means that your weight is greater than what is considered healthy for your body size. Obesity is determined by a measurement called BMI. BMI is an estimate of  body fat and is calculated from height and weight. For adults, a BMI of 30 or higher is considered obese. Obesity can eventually lead to other health concerns and major illnesses, including:  Stroke.  Coronary artery disease (CAD).  Type 2 diabetes.  Some types of cancer, including cancers of the colon, breast, uterus, and gallbladder.  Osteoarthritis.  High blood pressure (hypertension).  High cholesterol.  Sleep apnea.  Gallbladder stones.  Infertility problems.  What are the causes? The main cause of obesity is taking in (consuming) more calories than your body uses for energy. Other factors that contribute to this condition may include:  Being born with genes that make you more likely to become obese.  Having a medical condition  that causes obesity. These conditions include: ? Hypothyroidism. ? Polycystic ovarian syndrome (PCOS). ? Binge-eating disorder. ? Cushing syndrome.  Taking certain medicines, such as steroids, antidepressants, and seizure medicines.  Not being physically active (sedentary lifestyle).  Living where there are limited places to exercise safely or buy healthy foods.  Not getting enough sleep.  What increases the risk? The following factors may increase your risk of this condition:  Having a family history of obesity.  Being a woman of African-American descent.  Being a man of Hispanic descent.  What are the signs or symptoms? Having excessive body fat is the main symptom of this condition. How is this diagnosed? This condition may be diagnosed based on:  Your symptoms.  Your medical history.  A physical exam. Your health care provider may measure: ? Your BMI. If you are an adult with a BMI between 25 and less than 30, you are considered overweight. If you are an adult with a BMI of 30 or higher, you are considered obese. ? The distances around your hips and your waist (circumferences). These may be compared to each other to help diagnose your condition. ? Your skinfold thickness. Your health care provider may gently pinch a fold of your skin and measure it.  How is this treated? Treatment for this condition often includes changing your lifestyle. Treatment may include some or all of the following:  Dietary changes. Work with your health care provider and a dietitian to set a weight-loss goal that is healthy and reasonable for you. Dietary changes may include eating: ? Smaller portions. A portion size is the amount of a particular food that is healthy for you to eat at one time. This varies from person to person. ? Low-calorie or low-fat options. ? More whole grains, fruits, and vegetables.  Regular physical activity. This may include aerobic activity (cardio) and strength  training.  Medicine to help you lose weight. Your health care provider may prescribe medicine if you are unable to lose 1 pound a week after 6 weeks of eating more healthily and doing more physical activity.  Surgery. Surgical options may include gastric banding and gastric bypass. Surgery may be done if: ? Other treatments have not helped to improve your condition. ? You have a BMI of 40 or higher. ? You have life-threatening health problems related to obesity.  Follow these instructions at home:  Eating and drinking   Follow recommendations from your health care provider about what you eat and drink. Your health care provider may advise you to: ? Limit fast foods, sweets, and processed snack foods. ? Choose low-fat options, such as low-fat milk instead of whole milk. ? Eat 5 or more servings of fruits or vegetables every day. ? Eat at home more  often. This gives you more control over what you eat. ? Choose healthy foods when you eat out. ? Learn what a healthy portion size is. ? Keep low-fat snacks on hand. ? Avoid sugary drinks, such as soda, fruit juice, iced tea sweetened with sugar, and flavored milk. ? Eat a healthy breakfast.  Drink enough water to keep your urine clear or pale yellow.  Do not go without eating for long periods of time (do not fast) or follow a fad diet. Fasting and fad diets can be unhealthy and even dangerous. Physical Activity  Exercise regularly, as told by your health care provider. Ask your health care provider what types of exercise are safe for you and how often you should exercise.  Warm up and stretch before being active.  Cool down and stretch after being active.  Rest between periods of activity. Lifestyle  Limit the time that you spend in front of your TV, computer, or video game system.  Find ways to reward yourself that do not involve food.  Limit alcohol intake to no more than 1 drink a day for nonpregnant women and 2 drinks a day  for men. One drink equals 12 oz of beer, 5 oz of wine, or 1 oz of hard liquor. General instructions  Keep a weight loss journal to keep track of the food you eat and how much you exercise you get.  Take over-the-counter and prescription medicines only as told by your health care provider.  Take vitamins and supplements only as told by your health care provider.  Consider joining a support group. Your health care provider may be able to recommend a support group.  Keep all follow-up visits as told by your health care provider. This is important. Contact a health care provider if:  You are unable to meet your weight loss goal after 6 weeks of dietary and lifestyle changes. This information is not intended to replace advice given to you by your health care provider. Make sure you discuss any questions you have with your health care provider. Document Released: 06/21/2004 Document Revised: 10/17/2015 Document Reviewed: 03/02/2015 Elsevier Interactive Patient Education  2018 [employer].

## 2017-10-02 NOTE — Assessment & Plan Note (Signed)
Monitor lipids; work on weight loss

## 2017-10-02 NOTE — Progress Notes (Signed)
BP 118/78   Pulse 92   Temp 98.1 F (36.7 C) (Oral)   Resp 16   Ht '5\' 1"'  (1.549 m)   Wt 248 lb 1.6 oz (112.5 kg)   SpO2 94%   BMI 46.88 kg/m    Subjective:    Patient ID: Ariel Johnston, female    DOB: January 02, 1961, 57 y.o.   MRN: [id number]  HPI: Ariel Johnston is a 57 y.o. female  Chief Complaint  Patient presents with  . Follow-up  . Diabetes    stopped actos due to bloating, and hungrey    HPI Patient is here for f/u She did like how the actos made her feel; felt bloated; was also eating everything under the sun; stopped the actos and the symptoms have gone away; stomach back down Blood sugars have been high; averaging 184 as of this morning Tired and sluggish She was on the victoza, but that was stopped; when she had the injection, she got a little rash that came up She is not interested in insulin  Morbid obesity; she says she was doing "good" but then started taking the pill and became starving; stopped taking it 3 weeks ago and once she stopped it, her appetite has decreased Trying to eat right; for dinner last night, tuna fish sandwich with mayonnaise (not fat free) Steak and salad for lunch; regular  Just started eating vegetables five years ago; did not eat those before; already met with nutritionist; does not want to go back Was going to the gym 2-3 times a week; just started doing yoga; doing 21 day yoga thing, on week 2  Depression screen [facility] 2/9 10/02/2017 06/19/2017 03/20/2017 12/11/2016 08/30/2016  Decreased Interest 0 0 0 0 0  Down, Depressed, Hopeless 0 0 0 0 0  PHQ - 2 Score 0 0 0 0 0    Relevant past medical, surgical, family and social history reviewed Past Medical History:  Diagnosis Date  . Depression   . Diabetes mellitus without complication ([place])   . GERD (gastroesophageal reflux disease)   . Hyperlipidemia   . Hypertension   . Sleep apnea    Past Surgical History:  Procedure Laterality Date  . COLONOSCOPY WITH PROPOFOL N/A  12/06/2015   Procedure: COLONOSCOPY WITH PROPOFOL;  Surgeon: Lucilla Lame, MD;  Location: [facility] ENDOSCOPY;  Service: Endoscopy;  Laterality: N/A;  . DILATION AND CURETTAGE OF UTERUS  2007   Family History  Problem Relation Age of Onset  . Hypertension Brother   . Kidney disease Brother   . Stroke Father   . Hypertension Maternal Grandmother   . Stroke Maternal Grandmother   . Cancer Paternal Grandmother        colon  . Diabetes Paternal Grandfather   . Heart disease Paternal Grandfather   . Hypertension Paternal Grandfather   . Breast cancer Neg Hx   . Ovarian cancer Neg Hx    Social History   Tobacco Use  . Smoking status: Current Every Day Smoker    Packs/day: 0.25    Years: 35.00    Pack years: 8.75    Types: Cigarettes  . Smokeless tobacco: Never Used  Substance Use Topics  . Alcohol use: No    Alcohol/week: 0.0 oz  . Drug use: No    Interim medical history since last visit reviewed. Allergies and medications reviewed  Review of Systems Per HPI unless specifically indicated above     Objective:    BP 118/78   Pulse 92  Temp 98.1 F (36.7 C) (Oral)   Resp 16   Ht '5\' 1"'  (1.549 m)   Wt 248 lb 1.6 oz (112.5 kg)   SpO2 94%   BMI 46.88 kg/m   Wt Readings from Last 3 Encounters:  10/02/17 248 lb 1.6 oz (112.5 kg)  06/19/17 243 lb 1.6 oz (110.3 kg)  04/17/17 247 lb 3.2 oz (112.1 kg)    Physical Exam  Constitutional: She appears well-developed and well-nourished. No distress.  HENT:  Head: Normocephalic and atraumatic.  Eyes: EOM are normal. No scleral icterus.  Neck: No thyromegaly present.  Cardiovascular: Normal rate, regular rhythm and normal heart sounds.  No murmur heard. Pulmonary/Chest: Effort normal and breath sounds normal. No respiratory distress. She has no wheezes.  Abdominal: Soft. Bowel sounds are normal. She exhibits no distension.  Musculoskeletal: She exhibits no edema.  Neurological: She is alert. She exhibits normal muscle tone.    Skin: Skin is warm and dry. She is not diaphoretic. No pallor.  Psychiatric: She has a normal mood and affect. Her behavior is normal. Judgment and thought content normal.   Diabetic Foot Form - Detailed   Diabetic Foot Exam - detailed Diabetic Foot exam was performed with the following findings:  Yes 10/02/2017 11:31 AM  Visual Foot Exam completed.:  Yes  Pulse Foot Exam completed.:  Yes  Right Dorsalis Pedis:  Present Left Dorsalis Pedis:  Present  Sensory Foot Exam Completed.:  Yes Semmes-Weinstein Monofilament Test R Site 1-Great Toe:  Pos L Site 1-Great Toe:  Pos        Results for orders placed or performed in visit on 07/01/17  Lipid panel  Result Value Ref Range   Cholesterol 131 <200 mg/dL   HDL 51 >50 mg/dL   Triglycerides 105 <150 mg/dL   LDL Cholesterol (Calc) 61 mg/dL (calc)   Total CHOL/HDL Ratio 2.6 <5.0 (calc)   Non-HDL Cholesterol (Calc) 80 <130 mg/dL (calc)  Hemoglobin A1c  Result Value Ref Range   Hgb A1c MFr Bld 8.8 (H) <5.7 % of total Hgb   Mean Plasma Glucose 206 (calc)   eAG (mmol/L) 11.4 (calc)  COMPLETE METABOLIC PANEL WITH GFR  Result Value Ref Range   Glucose, Bld 195 (H) 65 - 99 mg/dL   BUN 15 7 - 25 mg/dL   Creat 0.80 0.50 - 1.05 mg/dL   GFR, Est Non African American 83 > OR = 60 mL/min/1.44m   GFR, Est African American 96 > OR = 60 mL/min/1.750m  BUN/Creatinine Ratio NOT APPLICABLE 6 - 22 (calc)   Sodium 138 135 - 146 mmol/L   Potassium 4.2 3.5 - 5.3 mmol/L   Chloride 101 98 - 110 mmol/L   CO2 26 20 - 32 mmol/L   Calcium 10.3 8.6 - 10.4 mg/dL   Total Protein 7.9 6.1 - 8.1 g/dL   Albumin 4.3 3.6 - 5.1 g/dL   Globulin 3.6 1.9 - 3.7 g/dL (calc)   AG Ratio 1.2 1.0 - 2.5 (calc)   Total Bilirubin 0.3 0.2 - 1.2 mg/dL   Alkaline phosphatase (APISO) 71 33 - 130 U/L   AST 15 10 - 35 U/L   ALT 19 6 - 29 U/L      Assessment & Plan:   Problem List Items Addressed This Visit      Endocrine   Type II diabetes mellitus, uncontrolled ([place]  Primary    She does not want insulin; reaction to victoza; will add welchol since her diabetes is uncontrolled;  explained the importance of weight loss        Other   Medication monitoring encounter   Relevant Orders   COMPLETE METABOLIC PANEL WITH GFR   Morbid obesity ([facility]) (Chronic)    Consider calorie counter; encouraged hydration; showed her the amount of fat in mayonnaise and salad dressing      Hyperlipidemia (Chronic)    Monitor lipids; work on weight loss      Relevant Orders   Lipid panel       Follow up plan: Return in about 3 months (around 01/02/2018) for follow-up visit with Dr. Sanda Klein.  An after-visit summary was printed and given to the patient at [facility].  Please see the patient instructions which may contain other information and recommendations beyond what is mentioned above in the assessment and plan.  No orders of the defined types were placed in this encounter.   Orders Placed This Encounter  Procedures  . Microalbumin / creatinine urine ratio  . Lipid panel  . Hemoglobin A1c  . COMPLETE METABOLIC PANEL WITH GFR

## 2017-10-02 NOTE — Assessment & Plan Note (Addendum)
She does not want insulin; reaction to victoza; will add welchol since her diabetes is uncontrolled; explained the importance of weight loss

## 2017-10-03 ENCOUNTER — Encounter: Payer: Self-pay | Admitting: Family Medicine

## 2017-10-03 ENCOUNTER — Other Ambulatory Visit: Payer: Self-pay | Admitting: Family Medicine

## 2017-10-03 ENCOUNTER — Telehealth: Payer: Self-pay | Admitting: Family Medicine

## 2017-10-03 DIAGNOSIS — E1129 Type 2 diabetes mellitus with other diabetic kidney complication: Secondary | ICD-10-CM

## 2017-10-03 DIAGNOSIS — E1165 Type 2 diabetes mellitus with hyperglycemia: Secondary | ICD-10-CM

## 2017-10-03 DIAGNOSIS — R809 Proteinuria, unspecified: Secondary | ICD-10-CM

## 2017-10-03 HISTORY — DX: Proteinuria, unspecified: R80.9

## 2017-10-03 HISTORY — DX: Type 2 diabetes mellitus with other diabetic kidney complication: E11.29

## 2017-10-03 LAB — HEMOGLOBIN A1C
Hgb A1c MFr Bld: 8.7 % of total Hgb — ABNORMAL HIGH (ref <5.7)
MEAN PLASMA GLUCOSE: 203 (calc)
eAG (mmol/L): 11.2 (calc)

## 2017-10-03 LAB — LIPID PANEL
CHOLESTEROL: 143 mg/dL (ref <200)
HDL: 46 mg/dL — ABNORMAL LOW (ref >50)
LDL CHOLESTEROL (CALC): 79 mg/dL
NON-HDL CHOLESTEROL (CALC): 97 mg/dL (ref <130)
Total CHOL/HDL Ratio: 3.1 (calc) (ref <5.0)
Triglycerides: 101 mg/dL (ref <150)

## 2017-10-03 LAB — MICROALBUMIN / CREATININE URINE RATIO
CREATININE, URINE: 81 mg/dL (ref 20–275)
MICROALB UR: 3.8 mg/dL
Microalb Creat Ratio: 47 mcg/mg creat — ABNORMAL HIGH (ref <30)

## 2017-10-03 LAB — COMPLETE METABOLIC PANEL WITH GFR
AG RATIO: 1.2 (calc) (ref 1.0–2.5)
ALT: 24 U/L (ref 6–29)
AST: 19 U/L (ref 10–35)
Albumin: 4.2 g/dL (ref 3.6–5.1)
Alkaline phosphatase (APISO): 70 U/L (ref 33–130)
BILIRUBIN TOTAL: 0.4 mg/dL (ref 0.2–1.2)
BUN: 15 mg/dL (ref 7–25)
CALCIUM: 9.5 mg/dL (ref 8.6–10.4)
CO2: 27 mmol/L (ref 20–32)
CREATININE: 0.73 mg/dL (ref 0.50–1.05)
Chloride: 101 mmol/L (ref 98–110)
GFR, Est African American: 107 mL/min/{1.73_m2} (ref >=60)
GFR, Est Non African American: 92 mL/min/{1.73_m2} (ref >=60)
GLOBULIN: 3.6 g/dL (ref 1.9–3.7)
Glucose, Bld: 152 mg/dL — ABNORMAL HIGH (ref 65–99)
Potassium: 3.9 mmol/L (ref 3.5–5.3)
Sodium: 138 mmol/L (ref 135–146)
TOTAL PROTEIN: 7.8 g/dL (ref 6.1–8.1)

## 2017-10-03 MED ORDER — COLESEVELAM HCL 3.75 G PO PACK: 0.5000 | PACK | Freq: Two times a day (BID) | ORAL | 2 refills | Status: DC

## 2017-10-03 NOTE — Telephone Encounter (Signed)
rx sent I decided to do the packets instead of the pills, may be better tolerated

## 2017-10-03 NOTE — Addendum Note (Signed)
Addended by: Vincentina Sollers, Janit Bern on: 10/03/2017 01:37 PM   Modules accepted: Orders

## 2017-10-03 NOTE — Telephone Encounter (Signed)
Copied from CRM [id number]. Topic: Quick Communication - Rx Refill/Question >> Oct 03, 2017 10:05 AM Crist Infante wrote: Medication: welchol Has the patient contacted their pharmacy? yes pt states Dr Sherie Don was going to add this to her meds, but not at the pharmacy.  [id number] - [place], [place] - [address] [phone] (Phone) [phone] (Fax)

## 2017-10-03 NOTE — Telephone Encounter (Signed)
Where you going to prescribe pt states that you all talked about this yesterday/

## 2017-10-03 NOTE — Progress Notes (Signed)
oprder endo

## 2017-10-14 ENCOUNTER — Encounter: Payer: Self-pay | Admitting: Family Medicine

## 2017-10-14 MED ORDER — DAPAGLIFLOZIN PROPANEDIOL 10 MG PO TABS: 10.0000 mg | ORAL_TABLET | Freq: Every day | ORAL | 3 refills | Status: DC

## 2017-11-24 ENCOUNTER — Other Ambulatory Visit: Payer: Self-pay | Admitting: Family Medicine

## 2017-11-25 NOTE — Telephone Encounter (Signed)
Diabetes now managed by [facility]; [facility]

## 2017-12-16 ENCOUNTER — Other Ambulatory Visit: Payer: Self-pay | Admitting: Family Medicine

## 2017-12-16 NOTE — Telephone Encounter (Signed)
Reviewed last A1c and last Cr; Rx approved

## 2017-12-26 ENCOUNTER — Other Ambulatory Visit: Payer: Self-pay | Admitting: Family Medicine

## 2017-12-26 NOTE — Telephone Encounter (Signed)
Last sgpt and lipids reviewed; Rx approved 

## 2018-01-07 ENCOUNTER — Ambulatory Visit: Payer: BLUE CROSS/BLUE SHIELD | Admitting: Family Medicine

## 2018-02-07 ENCOUNTER — Encounter: Payer: Self-pay | Admitting: Family Medicine

## 2018-02-07 ENCOUNTER — Ambulatory Visit: Payer: BLUE CROSS/BLUE SHIELD | Admitting: Family Medicine

## 2018-02-07 VITALS — BP 126/70 | HR 97 | Temp 98.5°F | Ht 61.0 in | Wt 247.2 lb

## 2018-02-07 DIAGNOSIS — E1129 Type 2 diabetes mellitus with other diabetic kidney complication: Secondary | ICD-10-CM

## 2018-02-07 DIAGNOSIS — Z720 Tobacco use: Secondary | ICD-10-CM

## 2018-02-07 DIAGNOSIS — I10 Essential (primary) hypertension: Secondary | ICD-10-CM

## 2018-02-07 DIAGNOSIS — E1165 Type 2 diabetes mellitus with hyperglycemia: Secondary | ICD-10-CM | POA: Diagnosis not present

## 2018-02-07 DIAGNOSIS — D582 Other hemoglobinopathies: Secondary | ICD-10-CM

## 2018-02-07 DIAGNOSIS — Z5181 Encounter for therapeutic drug level monitoring: Secondary | ICD-10-CM

## 2018-02-07 DIAGNOSIS — R809 Proteinuria, unspecified: Secondary | ICD-10-CM

## 2018-02-07 DIAGNOSIS — G4733 Obstructive sleep apnea (adult) (pediatric): Secondary | ICD-10-CM

## 2018-02-07 DIAGNOSIS — K219 Gastro-esophageal reflux disease without esophagitis: Secondary | ICD-10-CM

## 2018-02-07 DIAGNOSIS — L600 Ingrowing nail: Secondary | ICD-10-CM

## 2018-02-07 DIAGNOSIS — E782 Mixed hyperlipidemia: Secondary | ICD-10-CM

## 2018-02-07 MED ORDER — LORCASERIN HCL 10 MG PO TABS: 1.0000 | ORAL_TABLET | Freq: Two times a day (BID) | ORAL | 2 refills | Status: DC

## 2018-02-07 NOTE — Progress Notes (Signed)
BP 126/70   Pulse 97   Temp 98.5 F (36.9 C)   Ht _0  (1.549 m)   Wt 247 lb 3.2 oz (112.1 kg)   SpO2 97%   BMI 46.71 kg/m    Subjective:    Patient ID: Ariel Johnston, female    DOB: April 26, 1961, 57 y.o.   MRN: [id number]  HPI: Ariel Johnston is a 57 y.o. female  Chief Complaint  Patient presents with  . Follow-up  . Medication Refill    HPI Patient is here for f/u Would like to discuss weight loss and endorses fatigue states its ongoing but has recoginized it more this month. Would like something to kick start her weight loss  States was down to 239 a few weeks ago. States would like to really work on losing weight. States recently has been weighing herself every daily. Started weight watchers a year and half ago- has been off it.  Drinking [employer] More awareness with her weight and eating  Diet: this past week has been eating worse then normal:  Breakfast: bagel with butter and diet mountain dew  Lunch: fried chicken, mac and cheese, diet diet mountain dew Dinner: hibachi dinner.   2 weeks ago diet:  Breakfast: maybe skipped  Lunch: salad- plain salad with cucumbers and cheese, lettuce, carrots; sometimes would add chicken or egg Dinner: meat and vegetable.  Drinks: diet mountain dew.   Wt Readings from Last 3 Encounters:  02/07/18 247 lb 3.2 oz (112.1 kg)  10/02/17 248 lb 1.6 oz (112.5 kg)  06/19/17 243 lb 1.6 oz (110.3 kg)    Hypertension: Takes HCTZ and losartan- takes them daily, infrequently misses a dose. Working well for her. Goal blood pressure.  BP Readings from Last 3 Encounters:  02/07/18 126/70  10/02/17 118/78  07/15/17 112/74   Obstructive sleep apnea- does not wear CPAP- states has it at home but pulmonologist  want her to buy a new machine so they can read her data. States it costs $2000 and she cannot afford it so stopped using hers.   Acid Reflex: States diet controlled.   Diabetes Mellitus 2: farxiga (ran out last  week), janumet 06-7251 BID, trulictiy 6.6YQ once a week.  Fasting blood sugar over the past 2 weeks- 137-160; today was 137.  Lab Results  Component Value Date   HGBA1C 7.6 (H) 02/07/2018    Hyperlipidemia: Takes atorvastatin 66m nightly and welchol BID; states take welchol when she remembers- had forgotten about it for awhile but remembered the other day when she found her stash. Last taken welcohol maybe a month or two ago.  Lab Results  Component Value Date   CHOL 122 02/07/2018   HDL 50 (L) 02/07/2018   LDLCALC 57 02/07/2018   TRIG 69 02/07/2018   CHOLHDL 2.4 02/07/2018     Depression screen PHQ 2/9 02/07/2018 10/02/2017 06/19/2017 03/20/2017 12/11/2016  Decreased Interest 0 0 0 0 0  Down, Depressed, Hopeless 0 0 0 0 0  PHQ - 2 Score 0 0 0 0 0    Relevant past medical, surgical, family and social history reviewed Past Medical History:  Diagnosis Date  . Depression   . Diabetes mellitus without complication ([place]   . GERD (gastroesophageal reflux disease)   . Hyperlipidemia   . Hypertension   . Microalbuminuria due to type 2 diabetes mellitus ([place] 10/03/2017  . Sleep apnea    Past Surgical History:  Procedure Laterality Date  . COLONOSCOPY WITH PROPOFOL N/A  12/06/2015   Procedure: COLONOSCOPY WITH PROPOFOL;  Surgeon: Lucilla Lame, MD;  Location: [facility] ENDOSCOPY;  Service: Endoscopy;  Laterality: N/A;  . DILATION AND CURETTAGE OF UTERUS  2007   Family History  Problem Relation Age of Onset  . Hypertension Brother   . Kidney disease Brother   . Stroke Father   . Hypertension Maternal Grandmother   . Stroke Maternal Grandmother   . Cancer Paternal Grandmother        colon  . Diabetes Paternal Grandfather   . Heart disease Paternal Grandfather   . Hypertension Paternal Grandfather   . Breast cancer Neg Hx   . Ovarian cancer Neg Hx    Social History   Tobacco Use  . Smoking status: Current Every Day Smoker    Packs/day: 0.25    Years: 35.00    Pack years: 8.75     Types: Cigarettes  . Smokeless tobacco: Never Used  Substance Use Topics  . Alcohol use: No    Alcohol/week: 0.0 standard drinks  . Drug use: No    Interim medical history since last visit reviewed. Allergies and medications reviewed  Review of Systems Per HPI unless specifically indicated above     Objective:    BP 126/70   Pulse 97   Temp 98.5 F (36.9 C)   Ht _0  (1.549 m)   Wt 247 lb 3.2 oz (112.1 kg)   SpO2 97%   BMI 46.71 kg/m   Wt Readings from Last 3 Encounters:  02/07/18 247 lb 3.2 oz (112.1 kg)  10/02/17 248 lb 1.6 oz (112.5 kg)  06/19/17 243 lb 1.6 oz (110.3 kg)    Physical Exam  Constitutional: She appears well-developed and well-nourished. No distress.  HENT:  Head: Normocephalic and atraumatic.  Eyes: EOM are normal. No scleral icterus.  Neck: No thyromegaly present.  Cardiovascular: Normal rate, regular rhythm and normal heart sounds.  No murmur heard. Pulmonary/Chest: Effort normal and breath sounds normal. No respiratory distress. She has no wheezes.  Abdominal: Soft. Bowel sounds are normal. She exhibits no distension.  Musculoskeletal: She exhibits no edema.  Feet:  Right Foot:  Protective Sensation: 5 sites tested. 5 sites sensed.  Left Foot:  Protective Sensation: 5 sites tested. 5 sites sensed.  Neurological: She is alert.  Skin: Skin is warm and dry. She is not diaphoretic. No pallor.  Right great toenail is ingrown, no erythema  Psychiatric: She has a normal mood and affect. Her behavior is normal. Judgment and thought content normal.   Diabetic Foot Form - Detailed   Diabetic Foot Exam - detailed Diabetic Foot exam was performed with the following findings:  Yes 02/17/2018 10:01 AM  Visual Foot Exam completed.:  Yes  Pulse Foot Exam completed.:  Yes  Right Dorsalis Pedis:  Present Left Dorsalis Pedis:  Present  Sensory Foot Exam Completed.:  Yes Semmes-Weinstein Monofilament Test R Site 1-Great Toe:  Pos L Site 1-Great Toe:  Pos           Assessment & Plan:   Problem List Items Addressed This Visit      Cardiovascular and Mediastinum   Essential hypertension (Chronic)     Respiratory   Obstructive apnea   Relevant Orders   CBC with Differential/Platelet (Completed)     Digestive   Acid reflux     Endocrine   Type II diabetes mellitus, uncontrolled ([place]) - Primary   Relevant Medications   TRULICITY 1.5 SH/7.56YO SOPN   Other Relevant Orders  Ambulatory referral to Podiatry   Microalbumin / creatinine urine ratio (Completed)   Lipid panel (Completed)   Hemoglobin A1c (Completed)     Other   Medication monitoring encounter   Relevant Orders   COMPLETE METABOLIC PANEL WITH GFR (Completed)   Elevated hemoglobin (HCC)   Relevant Orders   CBC with Differential/Platelet (Completed)   Tobacco abuse   Morbid obesity (HCC) (Chronic)   Relevant Medications   TRULICITY 1.5 RF/1.6BW SOPN   Lorcaserin HCl (BELVIQ) 10 MG TABS   Hyperlipidemia (Chronic)   Relevant Orders   Lipid panel (Completed)    Other Visit Diagnoses    Ingrown right big toenail       Relevant Orders   Ambulatory referral to Podiatry       Follow up plan: Return in about 4 weeks (around 03/07/2018) for follow-up visit with Dr. Sanda Klein.  An after-visit summary was printed and given to the patient at [facility].  Please see the patient instructions which may contain other information and recommendations beyond what is mentioned above in the assessment and plan.  Meds ordered this encounter  Medications  . Lorcaserin HCl (BELVIQ) 10 MG TABS    Sig: Take 1 tablet by mouth 2 (two) times daily.    Dispense:  60 tablet    Refill:  2    Orders Placed This Encounter  Procedures  . Microalbumin / creatinine urine ratio  . Lipid panel  . Hemoglobin A1c  . COMPLETE METABOLIC PANEL WITH GFR  . CBC with Differential/Platelet  . Ambulatory referral to Podiatry

## 2018-02-07 NOTE — Patient Instructions (Addendum)
HDL removes extra cholesterol and plaque buildup in your arteries and then sends it to your liver to get rid of and helps reduce your risk of heart disease, heart attack, and stroke.  Foods that increase HDL: beans and legumes, whole grains, high-fiber fruits:prunes, apples, and pears; fatty fish- salmon, tuna, sardines; nuts, olive oil   Bad cholesterol, also called low-density lipoprotein (LDL), carries cholesterol and other fats that your liver makes to your body tissue. If it builds up in blood vessels, LDL can cause heart disease and other health problems. Your LDL level should be below 956100. If you have diabetes or a possible heart problem, your LDL should be below 70.  Eat: Eat 20 to 30 grams of soluble fiber every day. Foods such as fruits and vegetables, whole grains, beans, peas, nuts, and seeds can help lower LDL. Avoid: Saturated fats (Dairy foods - such as butter, cream, ghee, regular-fat milk and cheese. Meat - such as fatty cuts of beef, pork and lamb, processed meats like salami, sausages and the skin on chicken. Lard., fatty snack foods, cakes, biscuits, pies and deep fried foods) Avoid smoking  I do encourage you to quit smoking Call [phone] to sign up for smoking cessation classes You can call 1-800-QUIT-NOW to talk with a smoking cessation coach  [facility] [address], [address] [place], [place] [address]  22 miles - Get Directions Request an Appointment [phone]  Remind me to call  Still have questions? Connect with [facility] or review [facility] FAQ Ariel Johnston  Ariel Johnston

## 2018-02-08 LAB — MICROALBUMIN / CREATININE URINE RATIO
CREATININE, URINE: 117 mg/dL (ref 20–275)
Microalb Creat Ratio: 46 mcg/mg creat — ABNORMAL HIGH (ref <30)
Microalb, Ur: 5.4 mg/dL

## 2018-02-08 LAB — COMPLETE METABOLIC PANEL WITH GFR
AG Ratio: 1.2 (calc) (ref 1.0–2.5)
ALT: 23 U/L (ref 6–29)
AST: 17 U/L (ref 10–35)
Albumin: 4 g/dL (ref 3.6–5.1)
Alkaline phosphatase (APISO): 56 U/L (ref 33–130)
BUN: 13 mg/dL (ref 7–25)
CALCIUM: 9.3 mg/dL (ref 8.6–10.4)
CO2: 26 mmol/L (ref 20–32)
CREATININE: 0.82 mg/dL (ref 0.50–1.05)
Chloride: 102 mmol/L (ref 98–110)
GFR, EST NON AFRICAN AMERICAN: 80 mL/min/{1.73_m2} (ref >=60)
GFR, Est African American: 93 mL/min/{1.73_m2} (ref >=60)
GLOBULIN: 3.4 g/dL (ref 1.9–3.7)
Glucose, Bld: 117 mg/dL — ABNORMAL HIGH (ref 65–99)
Potassium: 4.1 mmol/L (ref 3.5–5.3)
SODIUM: 138 mmol/L (ref 135–146)
TOTAL PROTEIN: 7.4 g/dL (ref 6.1–8.1)
Total Bilirubin: 0.3 mg/dL (ref 0.2–1.2)

## 2018-02-08 LAB — CBC WITH DIFFERENTIAL/PLATELET
BASOS ABS: 43 {cells}/uL (ref 0–200)
Basophils Relative: 0.4 %
EOS ABS: 65 {cells}/uL (ref 15–500)
Eosinophils Relative: 0.6 %
HCT: 45.1 % — ABNORMAL HIGH (ref 35.0–45.0)
HEMOGLOBIN: 14.9 g/dL (ref 11.7–15.5)
Lymphs Abs: 2765 cells/uL (ref 850–3900)
MCH: 27.4 pg (ref 27.0–33.0)
MCHC: 33 g/dL (ref 32.0–36.0)
MCV: 82.9 fL (ref 80.0–100.0)
MONOS PCT: 10.8 %
MPV: 11.8 fL (ref 7.5–12.5)
Neutro Abs: 6761 cells/uL (ref 1500–7800)
Neutrophils Relative %: 62.6 %
Platelets: 220 10*3/uL (ref 140–400)
RBC: 5.44 10*6/uL — ABNORMAL HIGH (ref 3.80–5.10)
RDW: 13.6 % (ref 11.0–15.0)
Total Lymphocyte: 25.6 %
WBC mixed population: 1166 cells/uL — ABNORMAL HIGH (ref 200–950)
WBC: 10.8 10*3/uL (ref 3.8–10.8)

## 2018-02-08 LAB — LIPID PANEL
CHOL/HDL RATIO: 2.4 (calc) (ref <5.0)
CHOLESTEROL: 122 mg/dL (ref <200)
HDL: 50 mg/dL — ABNORMAL LOW (ref >50)
LDL Cholesterol (Calc): 57 mg/dL (calc)
Non-HDL Cholesterol (Calc): 72 mg/dL (calc) (ref <130)
Triglycerides: 69 mg/dL (ref <150)

## 2018-02-08 LAB — HEMOGLOBIN A1C
Hgb A1c MFr Bld: 7.6 % of total Hgb — ABNORMAL HIGH (ref <5.7)
Mean Plasma Glucose: 171 (calc)
eAG (mmol/L): 9.5 (calc)

## 2018-02-10 ENCOUNTER — Telehealth: Payer: Self-pay | Admitting: Family Medicine

## 2018-02-10 NOTE — Telephone Encounter (Signed)
Copied from CRM [id number]. Topic: Quick Communication - See Telephone Encounter >> Feb 10, 2018  8:48 AM Arlyss Gandyichardson, Meaghen Vecchiarelli N, NT wrote: CRM for notification. See Telephone encounter for: 02/10/18. Patient requesting a call from Dr. Sherie DonLada to see if another medicine can be ordered in place of the Lorcaserin HCl (BELVIQ) 10 MG TABS due to cost. It is costing her over $100 per month. Please advise.

## 2018-02-11 NOTE — Telephone Encounter (Signed)
Pt.notified

## 2018-02-11 NOTE — Telephone Encounter (Signed)
Please ask her to contact her insurance and find out what is covered under her current benefits

## 2018-02-12 NOTE — Telephone Encounter (Signed)
Pt called and stated that pharmacy in [place] did not have medication. She would like RX BELVIQ [address], [place], [place] [id number]. Please advise

## 2018-02-12 NOTE — Telephone Encounter (Signed)
Called in and updated pharmacy in chart

## 2018-02-17 ENCOUNTER — Ambulatory Visit: Payer: BLUE CROSS/BLUE SHIELD | Admitting: Podiatry

## 2018-02-17 ENCOUNTER — Encounter: Payer: Self-pay | Admitting: Podiatry

## 2018-02-17 DIAGNOSIS — B351 Tinea unguium: Secondary | ICD-10-CM

## 2018-02-17 DIAGNOSIS — M79674 Pain in right toe(s): Secondary | ICD-10-CM | POA: Diagnosis not present

## 2018-02-17 DIAGNOSIS — L600 Ingrowing nail: Secondary | ICD-10-CM | POA: Diagnosis not present

## 2018-02-17 NOTE — Assessment & Plan Note (Signed)
Encouraged cessation.

## 2018-02-17 NOTE — Progress Notes (Signed)
This patient presents the office with chief complaint of a painful ingrown toenail on the inside border of the right big toe. She  says this nail is painful walking and wearing her shoes.  This patient is diabetic and after consulting her A1c and her fasting blood sugar which is presently 7.6 and 117,  I proceeded to proceed  with nail surgery for the elimination of her ingrown toenail problem.  She presents the office today for definitive evaluation and treatment of this painful ingrown toenail.  Vascular  Dorsalis pedis and posterior tibial pulses are palpable  B/L.  Capillary return  WNL.  Temperature gradient is  WNL.  Skin turgor  WNL  Sensorium  Senn Weinstein monofilament wire  WNL. Normal tactile sensation.  Nail Exam  Pincer hallux toenails hallux  B/L with incurvated medial border right hallux nail.  Orthopedic  Exam  Muscle tone and muscle strength  WNL.  No limitations of motion feet  B/L.  No crepitus or joint effusion noted.  Foot type is unremarkable and digits show no abnormalities.  Hallux limitus 1st MPJ  B/L  Skin  No open lesions.  Normal skin texture and turgor.  Ingrown Pincer Hallux toenail right foot.  ROV.  Nail surgery.  Treatment options and alternatives discussed.  Recommended permanent phenol matrixectomy and patient agreed.  Right hallux  was prepped with alcohol and a toe block of 3cc of 2% lidocaine plain was administered in a digital toe block. .  The toe was then prepped with betadine solution . A tourniquet was applied to toe. The offending nail border was then excised and matrix tissue exposed.  Phenol was then applied to the matrix tissue followed by an alcohol wash.  Antibiotic ointment and a dry sterile dressing was applied.  The patient was dispensed instructions for aftercare. RTC 1 week.     Helane GuntherGregory Amair Shrout DPM

## 2018-02-17 NOTE — Assessment & Plan Note (Signed)
Goal LDL less than 70; continue statin; work on weight loss and healthier eating

## 2018-02-17 NOTE — Assessment & Plan Note (Signed)
Encouraged weight loss; control of sugars

## 2018-02-17 NOTE — Assessment & Plan Note (Signed)
Controlled; continue meds, work on weight loss

## 2018-02-17 NOTE — Assessment & Plan Note (Signed)
Encouraged weight loss, foot exam by MD

## 2018-02-17 NOTE — Assessment & Plan Note (Signed)
Encouragement given for weight loss; counseling given regarding healthy food options, water intake, etc.

## 2018-02-19 ENCOUNTER — Encounter: Payer: Self-pay | Admitting: Family Medicine

## 2018-02-24 ENCOUNTER — Encounter: Payer: Self-pay | Admitting: Podiatry

## 2018-02-24 ENCOUNTER — Ambulatory Visit: Payer: BLUE CROSS/BLUE SHIELD | Admitting: Podiatry

## 2018-02-24 DIAGNOSIS — Z09 Encounter for follow-up examination after completed treatment for conditions other than malignant neoplasm: Secondary | ICD-10-CM

## 2018-02-24 NOTE — Progress Notes (Signed)
This patient returns to the office following nail surgery one week ago.  The patient says toe has been soaked and bandaged as directed.  There has been improvement of the toe since the surgery has been performed. The patient presents for continued evaluation and treatment. Patient says there is bleeding and surgical site is sore to the touch.  GENERAL APPEARANCE: Alert, conversant. Appropriately groomed. No acute distress.  VASCULAR: Pedal pulses palpable at  [facility] and PT bilateral.  Capillary refill time is immediate to all digits,  Normal temperature gradient.    NEUROLOGIC: sensation is normal to 5.07 monofilament at 5/5 sites bilateral.  Light touch is intact bilateral, Muscle strength normal.  MUSCULOSKELETAL: acceptable muscle strength, tone and stability bilateral.  Intrinsic muscluature intact bilateral.  Rectus appearance of foot and digits noted bilateral.   DERMATOLOGIC: skin color, texture, and turgor are within normal limits.  No preulcerative lesions or ulcers  are seen, no interdigital maceration noted.   NAILS  There is necrotic tissue along the nail groove  In the absence of redness swelling and pain.  DX  S/p nail surgery  ROV  Home instructions were discussed.  Patient to call the office if there are any questions or concerns.   Helane Gunther DPM

## 2018-03-11 ENCOUNTER — Ambulatory Visit: Payer: BLUE CROSS/BLUE SHIELD | Admitting: Family Medicine

## 2018-03-12 ENCOUNTER — Other Ambulatory Visit: Payer: Self-pay | Admitting: Family Medicine

## 2018-04-15 ENCOUNTER — Ambulatory Visit: Payer: BLUE CROSS/BLUE SHIELD | Admitting: Family Medicine

## 2018-05-22 ENCOUNTER — Encounter: Payer: Self-pay | Admitting: Obstetrics & Gynecology

## 2018-05-22 ENCOUNTER — Other Ambulatory Visit (HOSPITAL_COMMUNITY)
Admission: RE | Admit: 2018-05-22 | Discharge: 2018-05-22 | Disposition: A | Discharge status: Home / Self Care | Payer: BLUE CROSS/BLUE SHIELD | Source: Ambulatory Visit | Attending: Obstetrics & Gynecology | Admitting: Obstetrics & Gynecology

## 2018-05-22 ENCOUNTER — Ambulatory Visit (INDEPENDENT_AMBULATORY_CARE_PROVIDER_SITE_OTHER): Payer: BLUE CROSS/BLUE SHIELD | Admitting: Obstetrics & Gynecology

## 2018-05-22 VITALS — BP 100/60 | Ht 61.0 in | Wt 244.0 lb

## 2018-05-22 DIAGNOSIS — E668 Other obesity: Secondary | ICD-10-CM

## 2018-05-22 DIAGNOSIS — Z1211 Encounter for screening for malignant neoplasm of colon: Secondary | ICD-10-CM

## 2018-05-22 DIAGNOSIS — Z1239 Encounter for other screening for malignant neoplasm of breast: Secondary | ICD-10-CM

## 2018-05-22 DIAGNOSIS — Z01419 Encounter for gynecological examination (general) (routine) without abnormal findings: Secondary | ICD-10-CM

## 2018-05-22 DIAGNOSIS — Z1389 Encounter for screening for other disorder: Secondary | ICD-10-CM | POA: Insufficient documentation

## 2018-05-22 DIAGNOSIS — Z8742 Personal history of other diseases of the female genital tract: Secondary | ICD-10-CM

## 2018-05-22 MED ORDER — MIRABEGRON ER 25 MG PO TB24: 25.0000 mg | ORAL_TABLET | Freq: Every day | ORAL | 11 refills | Status: DC

## 2018-05-22 NOTE — Patient Instructions (Addendum)
PAP every three years Mammogram every year (due late Jan 2020)    Call [phone] [facility] every 10 years (up to date) Labs yearly (with PCP)  Mirabegron extended-release tablets What is this medicine? MIRABEGRON (MIR a BEG ron) is used to treat overactive bladder. This medicine reduces the amount of bathroom visits. It may also help to control wetting accidents. It may be used alone, but sometimes may be given with other treatments. This medicine may be used for other purposes; ask your health care provider or pharmacist if you have questions. COMMON BRAND NAME(S): Myrbetriq What should I tell my health care provider before I take this medicine? They need to know if you have any of these conditions: -high blood pressure -kidney disease -liver disease -problems urinating -prostate disease -an unusual or allergic reaction to mirabegron, other medicines, foods, dyes, or preservatives -pregnant or trying to get pregnant -breast-feeding How should I use this medicine? Take this medicine by mouth with a glass of water. Follow the directions on the prescription label. Do not cut, crush or chew this medicine. You can take it with or without food. If it upsets your stomach, take it with food. Take your medicine at regular intervals. Do not take it more often than directed. Do not stop taking except on your doctor's advice. Talk to your pediatrician regarding the use of this medicine in children. Special care may be needed. Overdosage: If you think you have taken too much of this medicine contact a poison control center or emergency room at once. NOTE: This medicine is only for you. Do not share this medicine with others. What if I miss a dose? If you miss a dose, take it as soon as you can. If it is almost time for your next dose, take only that dose. Do not take double or extra doses. What may interact with this  medicine? -codeine -desipramine -digoxin -flecainide -MAOIs like Carbex, Eldepryl, Marplan, Nardil, and Parnate -methadone -metoprolol -pimozide -propafenone -thioridazine -warfarin This list may not describe all possible interactions. Give your health care provider a list of all the medicines, herbs, non-prescription drugs, or dietary supplements you use. Also tell them if you smoke, drink alcohol, or use illegal drugs. Some items may interact with your medicine. What should I watch for while using this medicine? Visit your doctor or health care professional for regular checks on your progress. Check your blood pressure as directed. Ask your doctor or health care professional what your blood pressure should be and when you should contact him or her. You may need to limit your intake of tea, coffee, caffeinated sodas, or alcohol. These drinks may make your symptoms worse. What side effects may I notice from receiving this medicine? Side effects that you should report to your doctor or health care professional as soon as possible: -allergic reactions like skin rash, itching or hives, swelling of the face, lips, or tongue -high blood pressure -fast, irregular heartbeat -redness, blistering, peeling or loosening of the skin, including inside the mouth -signs of infection like fever or chills; pain or difficulty passing urine -trouble passing urine or change in the amount of urine Side effects that usually do not require medical attention (report to your doctor or health care professional if they continue or are bothersome): -constipation -dry mouth -headache -runny nose -stomach upset This list may not describe all possible side effects. Call your doctor for medical advice about side effects. You may report side effects to FDA at 1-800-FDA-1088.  Where should I keep my medicine? Keep out of the reach of children. Store at room temperature between 15 and 30 degrees C (59 and 86 degrees  F). Throw away any unused medicine after the expiration date. NOTE: This sheet is a summary. It may not cover all possible information. If you have questions about this medicine, talk to your doctor, pharmacist, or health care provider.  2019 Elsevier/Gold Standard (2016-10-04 11:33:21)

## 2018-05-22 NOTE — Progress Notes (Signed)
HPI:      [name] is a 57 y.o. G0P0000 who LMP was in the past, she presents today for her annual examination.  The patient has no complaints today. The patient is sexually active. Herlast pap: approximate date 2016 and was normal and last mammogram: approximate date 2019 (Jan) and was normal. Prior history of abnormal PAP years ago. The patient does perform self breast exams.  There is no notable family history of breast or ovarian cancer in her family. The patient is not taking hormone replacement therapy. Patient denies post-menopausal vaginal bleeding.   The patient has regular exercise: yes. The patient denies current symptoms of depression.  Has urinary urgency without freq/nocturia and w some LOU at times.  GYN Hx: Last Colonoscopy:2 years ago. Normal.  Last DEXA: never ago.    PMHx: Past Medical History:  Diagnosis Date  . Depression   . Diabetes mellitus without complication (HCC)   . GERD (gastroesophageal reflux disease)   . Hyperlipidemia   . Hypertension   . Microalbuminuria due to type 2 diabetes mellitus (HCC) 10/03/2017  . Sleep apnea    Past Surgical History:  Procedure Laterality Date  . COLONOSCOPY WITH PROPOFOL N/A 12/06/2015   Procedure: COLONOSCOPY WITH PROPOFOL;  Surgeon: Midge Miniumarren Wohl, MD;  Location: [facility] ENDOSCOPY;  Service: Endoscopy;  Laterality: N/A;  . DILATION AND CURETTAGE OF UTERUS  2007   Family History  Problem Relation Age of Onset  . Hypertension Brother   . Kidney disease Brother   . Stroke Father   . Hypertension Maternal Grandmother   . Stroke Maternal Grandmother   . Cancer Paternal Grandmother        colon  . Diabetes Paternal Grandfather   . Heart disease Paternal Grandfather   . Hypertension Paternal Grandfather   . Breast cancer Neg Hx   . Ovarian cancer Neg Hx    Social History   Tobacco Use  . Smoking status: Current Every Day Smoker    Packs/day: 0.25    Years: 35.00    Pack years: 8.75    Types: Cigarettes  .  Smokeless tobacco: Never Used  Substance Use Topics  . Alcohol use: No    Alcohol/week: 0.0 standard drinks  . Drug use: No    Current Outpatient Medications:  .  aspirin (ASPIRIN ADULT LOW DOSE) 81 MG EC tablet, Take 81 mg by mouth daily. , Disp: , Rfl:  .  atorvastatin (LIPITOR) 40 MG tablet, TAKE 1 TABLET (40 MG TOTAL) BY MOUTH AT BEDTIME., Disp: 90 tablet, Rfl: 1 .  Cholecalciferol (VITAMIN D PO), Take by mouth daily., Disp: , Rfl:  .  Colesevelam HCl 3.75 g PACK, Take 0.5 packets by mouth 2 (two) times daily with a meal. Mix in 4-8 ounces of water, fruit juice, or diet soft drink, Disp: 30 each, Rfl: 2 .  Cyanocobalamin (B-12) 1000 MCG CAPS, Take 1 capsule by mouth as needed., Disp: , Rfl:  .  dapagliflozin propanediol (FARXIGA) 10 MG TABS tablet, Take 10 mg by mouth daily., Disp: 90 tablet, Rfl: 3 .  glucose blood (ONE TOUCH ULTRA TEST) test strip, Use as instructed, check FSBS once a day; Dx E11.65, LON 99 months, Disp: 100 each, Rfl: 3 .  hydrochlorothiazide (HYDRODIURIL) 25 MG tablet, TAKE 1/2 TABLET (12.5 MG TOTAL) BY MOUTH DAILY, Disp: 15 tablet, Rfl: 11 .  JANUMET 50-1000 MG tablet, TAKE 1 TABLET BY MOUTH 2 (TWO) TIMES DAILY., Disp: 180 tablet, Rfl: 1 .  Lorcaserin HCl (  BELVIQ) 10 MG TABS, Take 1 tablet by mouth 2 (two) times daily., Disp: 60 tablet, Rfl: 2 .  losartan (COZAAR) 100 MG tablet, TAKE 1 TABLET (100 MG TOTAL) BY MOUTH DAILY., Disp: 90 tablet, Rfl: 1 .  TRULICITY 1.5 MG/0.5ML SOPN, Inject into the skin once a week., Disp: , Rfl:  .  mirabegron ER (MYRBETRIQ) 25 MG TB24 tablet, Take 1 tablet (25 mg total) by mouth daily., Disp: 30 tablet, Rfl: 11 Allergies: Actos [pioglitazone] and Victoza [liraglutide]  Review of Systems  Constitutional: Negative for chills, fever and malaise/fatigue.  HENT: Negative for congestion, sinus pain and sore throat.   Eyes: Negative for blurred vision and pain.  Respiratory: Negative for cough and wheezing.   Cardiovascular: Negative for  chest pain and leg swelling.  Gastrointestinal: Negative for abdominal pain, constipation, diarrhea, heartburn, nausea and vomiting.  Genitourinary: Negative for dysuria, frequency, hematuria and urgency.  Musculoskeletal: Negative for back pain, joint pain, myalgias and neck pain.  Skin: Negative for itching and rash.  Neurological: Negative for dizziness, tremors and weakness.  Endo/Heme/Allergies: Does not bruise/bleed easily.  Psychiatric/Behavioral: Negative for depression. The patient is not nervous/anxious and does not have insomnia.    Objective: BP 100/60   Ht 5\' 1"  (1.549 m)   Wt 244 lb (110.7 kg)   BMI 46.10 kg/m   Filed Weights   05/22/18 0758  Weight: 244 lb (110.7 kg)   Body mass index is 46.1 kg/m. Physical Exam Constitutional:      General: She is not in acute distress.    Appearance: She is well-developed.  Genitourinary:     Pelvic exam was performed with patient supine.     Vagina, uterus and rectum normal.     No lesions in the vagina.     No vaginal bleeding.     No cervical motion tenderness, friability, lesion or polyp.     Uterus is mobile.     Uterus is not enlarged.     No uterine mass detected.    Uterus is midaxial.     No right or left adnexal mass present.     Right adnexa not tender.     Left adnexa not tender.  HENT:     Head: Normocephalic and atraumatic. No laceration.     Right Ear: Hearing normal.     Left Ear: Hearing normal.     Mouth/Throat:     Pharynx: Uvula midline.  Eyes:     Pupils: Pupils are equal, round, and reactive to light.  Neck:     Musculoskeletal: Normal range of motion and neck supple.     Thyroid: No thyromegaly.  Cardiovascular:     Rate and Rhythm: Normal rate and regular rhythm.     Heart sounds: No murmur. No friction rub. No gallop.   Pulmonary:     Effort: Pulmonary effort is normal. No respiratory distress.     Breath sounds: Normal breath sounds. No wheezing.  Chest:     Breasts:        Right: No  mass, skin change or tenderness.        Left: No mass, skin change or tenderness.  Abdominal:     General: Bowel sounds are normal. There is no distension.     Palpations: Abdomen is soft.     Tenderness: There is no abdominal tenderness. There is no rebound.  Musculoskeletal: Normal range of motion.  Neurological:     Mental Status: She is alert and oriented to  person, place, and time.     Cranial Nerves: No cranial nerve deficit.  Skin:    General: Skin is warm and dry.  Psychiatric:        Judgment: Judgment normal.  Vitals signs reviewed.   Assessment: Annual Exam 1. Women's annual routine gynecological examination   2. Screening for breast cancer   3. History of abnormal cervical Pap smear   4. Extreme obesity   5. Screen for colon cancer   6. Encounter for surveillance of abnormal nevi    Plan:            1.  Cervical Screening-  Pap smear done today  2. Breast screening- Exam annually and mammogram scheduled  3. Colonoscopy every 10 years, Hemoccult testing after age 56  4. Labs managed by PCP  5. Counseling for hormonal therapy: none  6. Obesity, risk factors discussed  7. OAB, Urgency.  Options for meds discussed.  Myrbetriq Rx discussed and Rx.  F/U to see how responds.  8. Derm referral (moles abnormal, never been checked) Esp one that has enlarged in right areola     F/U  Return in about 2 months (around 07/23/2018) for Follow up.  Annamarie MajorPaul , MD, Merlinda FrederickFACOG [facility], [facility] 05/22/2018  8:25 AM

## 2018-05-24 LAB — HM DIABETES EYE EXAM

## 2018-05-29 LAB — CYTOLOGY - PAP
Diagnosis: NEGATIVE
HPV: NOT DETECTED

## 2018-06-09 ENCOUNTER — Ambulatory Visit: Payer: BLUE CROSS/BLUE SHIELD | Admitting: Family Medicine

## 2018-06-09 ENCOUNTER — Encounter: Payer: Self-pay | Admitting: Family Medicine

## 2018-06-09 VITALS — BP 112/64 | HR 99 | Temp 98.0°F | Ht 61.0 in | Wt 244.3 lb

## 2018-06-09 DIAGNOSIS — I10 Essential (primary) hypertension: Secondary | ICD-10-CM | POA: Diagnosis not present

## 2018-06-09 DIAGNOSIS — E668 Other obesity: Secondary | ICD-10-CM | POA: Diagnosis not present

## 2018-06-09 DIAGNOSIS — E1165 Type 2 diabetes mellitus with hyperglycemia: Secondary | ICD-10-CM | POA: Diagnosis not present

## 2018-06-09 DIAGNOSIS — Z720 Tobacco use: Secondary | ICD-10-CM

## 2018-06-09 DIAGNOSIS — E669 Obesity, unspecified: Secondary | ICD-10-CM

## 2018-06-09 DIAGNOSIS — G4733 Obstructive sleep apnea (adult) (pediatric): Secondary | ICD-10-CM

## 2018-06-09 NOTE — Assessment & Plan Note (Signed)
Encouraged diet and exercise; mindfulness; refer to nutritonist; she will exercise 30 minutes at a time at least five days a week; start slowly and build up gradually and she agrees; mindfulness classes through work; return in one month and consider Belviq at that time if truly invested

## 2018-06-09 NOTE — Progress Notes (Signed)
BP 112/64   Pulse 99   Temp 98 F (36.7 C)   Ht 5\' 1"  (1.549 m)   Wt 244 lb 4.8 oz (110.8 kg)   SpO2 98%   BMI 46.16 kg/m    Subjective:    Patient ID: Ariel Johnston, female    DOB: 06/17/1960, 58 y.o.   MRN: [id number]  HPI: Ariel Johnston is a 58 y.o. female  Chief Complaint  Patient presents with  . Follow-up    HPI Here for f/u; type 2 diabetes; managed by endocrinology She was in school, doing real estate school and graduated; it was the most stressful school she has ever done; not ever that stressed out in her life she says She thinks her numbers are going to be bad; she ate wrong and slept wrong; grab and go food; now she is settling back down She was checking sugars almost every day; for the most part, running in the 200s; this morning 170, she is coming down; she back to being more conscious about what she is eating She believes it is what she does and what she eats She canceled Christmas and New Year's Went to see the foot doctor, right great toenail partial matrixectomy; callus on the left foot that he is said okay and just watch  Lab Results  Component Value Date   HGBA1C 7.6 (H) 02/07/2018   High cholesterol; not taking welchol  Lab Results  Component Value Date   CHOL 122 02/07/2018   HDL 50 (L) 02/07/2018   LDLCALC 57 02/07/2018   TRIG 69 02/07/2018   CHOLHDL 2.4 02/07/2018   Obesity; she was on Belviq the last time she was here; she does think that it was working though and ran out; had tests to do for school, got way off track with that  Sleep apnea; elevated RBC; not using machine, there is nothing wrong with it she says; she went over to the sleep people; the machine works, but they cannot read it, they want her to get a new machine and they want her to get a new machine; she could use it She plans to work out 3 days a week, treadmill at home to get her started; the days that she does not do the treadmill, she'll do yoga or other tape;  minimum of 30   She is not taking Myrbetriq; saw her gynecologist; he gave her some medicine but she hasn't started taking it yet  She is still smoking; about 1/2 ppd or a little less; she is aware of dangers; not smoking to prevent weight gain  Depression screen [facility] 2/9 06/09/2018 02/07/2018 10/02/2017 06/19/2017 03/20/2017  Decreased Interest 0 0 0 0 0  Down, Depressed, Hopeless 0 0 0 0 0  PHQ - 2 Score 0 0 0 0 0  Altered sleeping 0 - - - -  Tired, decreased energy 0 - - - -  Change in appetite 0 - - - -  Feeling bad or failure about yourself  0 - - - -  Trouble concentrating 0 - - - -  Moving slowly or fidgety/restless 0 - - - -  Suicidal thoughts 0 - - - -  PHQ-9 Score 0 - - - -  Difficult doing work/chores Not difficult at all - - - -   Fall Risk  06/09/2018 02/07/2018 10/02/2017 06/19/2017 03/20/2017  Falls in the past year? 0 No No No Yes  Number falls in past yr: - - - - 1  Relevant past medical, surgical, family and social history reviewed Past Medical History:  Diagnosis Date  . Depression   . Diabetes mellitus without complication (HCC)   . GERD (gastroesophageal reflux disease)   . Hyperlipidemia   . Hypertension   . Microalbuminuria due to type 2 diabetes mellitus (HCC) 10/03/2017  . Sleep apnea    Past Surgical History:  Procedure Laterality Date  . COLONOSCOPY WITH PROPOFOL N/A 12/06/2015   Procedure: COLONOSCOPY WITH PROPOFOL;  Surgeon: Midge Miniumarren Wohl, MD;  Location: [facility] ENDOSCOPY;  Service: Endoscopy;  Laterality: N/A;  . DILATION AND CURETTAGE OF UTERUS  2007   Family History  Problem Relation Age of Onset  . Hypertension Brother   . Kidney disease Brother   . Stroke Father   . Hypertension Maternal Grandmother   . Stroke Maternal Grandmother   . Cancer Paternal Grandmother        colon  . Diabetes Paternal Grandfather   . Heart disease Paternal Grandfather   . Hypertension Paternal Grandfather   . Breast cancer Neg Hx   . Ovarian cancer Neg Hx    Social  History   Tobacco Use  . Smoking status: Current Every Day Smoker    Packs/day: 0.25    Years: 35.00    Pack years: 8.75    Types: Cigarettes  . Smokeless tobacco: Never Used  Substance Use Topics  . Alcohol use: No    Alcohol/week: 0.0 standard drinks  . Drug use: No     [facility]  AUDIT-C Score  0      Interim medical history since last visit reviewed. Allergies and medications reviewed  Review of Systems Per HPI unless specifically indicated above     Objective:    BP 112/64   Pulse 99   Temp 98 F (36.7 C)   Ht 5\' 1"  (1.549 m)   Wt 244 lb 4.8 oz (110.8 kg)   SpO2 98%   BMI 46.16 kg/m   Wt Readings from Last 3 Encounters:  06/09/18 244 lb 4.8 oz (110.8 kg)  05/22/18 244 lb (110.7 kg)  02/07/18 247 lb 3.2 oz (112.1 kg)    Physical Exam Constitutional:      General: She is not in acute distress.    Appearance: She is well-developed. She is not diaphoretic.  HENT:     Head: Normocephalic and atraumatic.  Eyes:     General: No scleral icterus. Neck:     Thyroid: No thyromegaly.  Cardiovascular:     Rate and Rhythm: Normal rate and regular rhythm.     Heart sounds: Normal heart sounds. No murmur.  Pulmonary:     Effort: Pulmonary effort is normal. No respiratory distress.     Breath sounds: Normal breath sounds. No wheezing.  Abdominal:     General: Bowel sounds are normal. There is no distension.     Palpations: Abdomen is soft.  Skin:    General: Skin is warm and dry.     Coloration: Skin is not pale.  Neurological:     Mental Status: She is alert.  Psychiatric:        Behavior: Behavior normal.        Thought Content: Thought content normal.        Judgment: Judgment normal.    Diabetic Foot Form - Detailed   Diabetic Foot Exam - detailed Diabetic Foot exam was performed with the following findings:  Yes 06/09/2018  8:11 AM  Visual  Foot Exam completed.:  Yes  Pulse Foot Exam completed.:   Yes  Right Dorsalis Pedis:  Present Left Dorsalis Pedis:  Present  Sensory Foot Exam Completed.:  Yes Semmes-Weinstein Monofilament Test R Site 1-Great Toe:  Pos L Site 1-Great Toe:  Pos    Comments:  Callus under the left ball of foot between 1st and 2nd MT     Results for orders placed or performed in visit on 05/22/18  Cytology - PAP  Result Value Ref Range   Adequacy      Satisfactory for evaluation  endocervical/transformation zone component PRESENT.   Diagnosis      NEGATIVE FOR INTRAEPITHELIAL LESIONS OR MALIGNANCY. BENIGN REACTIVE/REPARATIVE CHANGES.   HPV NOT DETECTED    Material Submitted CervicoVaginal Pap [ThinPrep Imaged]       Assessment & Plan:   Problem List Items Addressed This Visit      Cardiovascular and Mediastinum   Essential hypertension (Chronic)    Controlled without symptoms; limit salt more, follow DASH guidelines        Respiratory   Obstructive apnea    Explained risk of untreated OSA, including HTN, CHF, sudden death; she was seen by pulmonologist        Endocrine   Type II diabetes mellitus, uncontrolled (HCC) - Primary    Managed by endocrinologist; foot exam by MD today; seeing endo next week; taking all diabetes medicine; she will be working on weight loss      Relevant Orders   Amb ref to Medical Nutrition Therapy-MNT     Other   Extreme obesity (Chronic)   Tobacco abuse    Urged smoking cessation, risk of heart attack, stroke, many types of cancer      Morbid obesity (HCC)    Encouraged diet and exercise; mindfulness; refer to nutritonist; she will exercise 30 minutes at a time at least five days a week; start slowly and build up gradually and she agrees; mindfulness classes through work; return in one month and consider Belviq at that time if truly invested      Relevant Orders   Amb ref to Medical Nutrition Therapy-MNT       Follow up plan: No follow-ups on file.  An after-visit summary was printed and given to the  patient at check-out.  Please see the patient instructions which may contain other information and recommendations beyond what is mentioned above in the assessment and plan.  No orders of the defined types were placed in this encounter.   Orders Placed This Encounter  Procedures  . Amb ref to Medical Nutrition Therapy-MNT

## 2018-06-09 NOTE — Assessment & Plan Note (Signed)
Explained risk of untreated OSA, including HTN, CHF, sudden death; she was seen by pulmonologist

## 2018-06-09 NOTE — Assessment & Plan Note (Signed)
Managed by endocrinologist; foot exam by MD today; seeing endo next week; taking all diabetes medicine; she will be working on weight loss

## 2018-06-09 NOTE — Patient Instructions (Addendum)
We'll have you meet with the nutritionist Exercise 30 minutes at a time on 3 or more days a week; build up gradually and slowly Return in one month Check out the information at familydoctor.org entitled "Nutrition for Weight Loss: What You Need to Know about Fad Diets" Try to lose between 1-2 pounds per week by taking in fewer calories and burning off more calories You can succeed by limiting portions, limiting foods dense in calories and fat, becoming more active, and drinking 8 glasses of water a day (64 ounces) Don't skip meals, especially breakfast, as skipping meals may alter your metabolism Do not use over-the-counter weight loss pills or gimmicks that claim rapid weight loss A healthy BMI (or body mass index) is between 18.5 and 24.9 You can calculate your ideal BMI at the NIH website JobE[employer].hu  I do encourage you to quit smoking Call [phone] to sign up for smoking cessation classes You can call 1-800-QUIT-NOW to talk with a smoking cessation coach   We'll get labs at the next visit  Obesity, Adult Obesity is the condition of having too much total body fat. Being overweight or obese means that your weight is greater than what is considered healthy for your body size. Obesity is determined by a measurement called BMI. BMI is an estimate of body fat and is calculated from height and weight. For adults, a BMI of 30 or higher is considered obese. Obesity can eventually lead to other health concerns and major illnesses, including:  Stroke.  Coronary artery disease (CAD).  Type 2 diabetes.  Some types of cancer, including cancers of the colon, breast, uterus, and gallbladder.  Osteoarthritis.  High blood pressure (hypertension).  High cholesterol.  Sleep apnea.  Gallbladder stones.  Infertility problems. What are the causes? The main cause of obesity is taking in (consuming) more calories than your body uses for  energy. Other factors that contribute to this condition may include:  Being born with genes that make you more likely to become obese.  Having a medical condition that causes obesity. These conditions include: ? Hypothyroidism. ? Polycystic ovarian syndrome (PCOS). ? Binge-eating disorder. ? Cushing syndrome.  Taking certain medicines, such as steroids, antidepressants, and seizure medicines.  Not being physically active (sedentary lifestyle).  Living where there are limited places to exercise safely or buy healthy foods.  Not getting enough sleep. What increases the risk? The following factors may increase your risk of this condition:  Having a family history of obesity.  Being a woman of African-American descent.  Being a man of Hispanic descent. What are the signs or symptoms? Having excessive body fat is the main symptom of this condition. How is this diagnosed? This condition may be diagnosed based on:  Your symptoms.  Your medical history.  A physical exam. Your health care provider may measure: ? Your BMI. If you are an adult with a BMI between 25 and less than 30, you are considered overweight. If you are an adult with a BMI of 30 or higher, you are considered obese. ? The distances around your hips and your waist (circumferences). These may be compared to each other to help diagnose your condition. ? Your skinfold thickness. Your health care provider may gently pinch a fold of your skin and measure it. How is this treated? Treatment for this condition often includes changing your lifestyle. Treatment may include some or all of the following:  Dietary changes. Work with your health care provider and a dietitian to set a  weight-loss goal that is healthy and reasonable for you. Dietary changes may include eating: ? Smaller portions. A portion size is the amount of a particular food that is healthy for you to eat at one time. This varies from person to  person. ? Low-calorie or low-fat options. ? More whole grains, fruits, and vegetables.  Regular physical activity. This may include aerobic activity (cardio) and strength training.  Medicine to help you lose weight. Your health care provider may prescribe medicine if you are unable to lose 1 pound a week after 6 weeks of eating more healthily and doing more physical activity.  Surgery. Surgical options may include gastric banding and gastric bypass. Surgery may be done if: ? Other treatments have not helped to improve your condition. ? You have a BMI of 40 or higher. ? You have life-threatening health problems related to obesity. Follow these instructions at home:  Eating and drinking   Follow recommendations from your health care provider about what you eat and drink. Your health care provider may advise you to: ? Limit fast foods, sweets, and processed snack foods. ? Choose low-fat options, such as low-fat milk instead of whole milk. ? Eat 5 or more servings of fruits or vegetables every day. ? Eat at home more often. This gives you more control over what you eat. ? Choose healthy foods when you eat out. ? Learn what a healthy portion size is. ? Keep low-fat snacks on hand. ? Avoid sugary drinks, such as soda, fruit juice, iced tea sweetened with sugar, and flavored milk. ? Eat a healthy breakfast.  Drink enough water to keep your urine clear or pale yellow.  Do not go without eating for long periods of time (do not fast) or follow a fad diet. Fasting and fad diets can be unhealthy and even dangerous. Physical Activity  Exercise regularly, as told by your health care provider. Ask your health care provider what types of exercise are safe for you and how often you should exercise.  Warm up and stretch before being active.  Cool down and stretch after being active.  Rest between periods of activity. Lifestyle  Limit the time that you spend in front of your TV, computer, or  video game system.  Find ways to reward yourself that do not involve food.  Limit alcohol intake to no more than 1 drink a day for nonpregnant women and 2 drinks a day for men. One drink equals 12 oz of beer, 5 oz of wine, or 1 oz of hard liquor. General instructions  Keep a weight loss journal to keep track of the food you eat and how much you exercise you get.  Take over-the-counter and prescription medicines only as told by your health care provider.  Take vitamins and supplements only as told by your health care provider.  Consider joining a support group. Your health care provider may be able to recommend a support group.  Keep all follow-up visits as told by your health care provider. This is important. Contact a health care provider if:  You are unable to meet your weight loss goal after 6 weeks of dietary and lifestyle changes. This information is not intended to replace advice given to you by your health care provider. Make sure you discuss any questions you have with your health care provider. Document Released: 06/21/2004 Document Revised: 10/17/2015 Document Reviewed: 03/02/2015 Elsevier Interactive Patient Education  2019 Elsevier Inc.  Preventing Unhealthy [employer], Adult Staying at a healthy weight  is important to your overall health. When fat builds up in your body, you may become overweight or obese. Being overweight or obese increases your risk of developing certain health problems, such as heart disease, diabetes, sleeping problems, joint problems, and some types of cancer. Unhealthy weight gain is often the result of making unhealthy food choices or not getting enough exercise. You can make changes to your lifestyle to prevent obesity and stay as healthy as possible. What nutrition changes can be made?   Eat only as much as your body needs. To do this: ? Pay attention to signs that you are hungry or full. Stop eating as soon as you feel full. ? If you feel  hungry, try drinking water first before eating. Drink enough water so your urine is clear or pale yellow. ? Eat smaller portions. Pay attention to portion sizes when eating out. ? Look at serving sizes on food labels. Most foods contain more than one serving per container. ? Eat the recommended number of calories for your gender and activity level. For most active people, a daily total of 2,000 calories is appropriate. If you are trying to lose weight or are not very active, you may need to eat fewer calories. Talk with your health care provider or a diet and nutrition specialist (dietitian) about how many calories you need each day.  Choose healthy foods, such as: ? Fruits and vegetables. At each meal, try to fill at least half of your plate with fruits and vegetables. ? Whole grains, such as whole-wheat bread, brown rice, and quinoa. ? Lean meats, such as chicken or fish. ? Other healthy proteins, such as beans, eggs, or tofu. ? Healthy fats, such as nuts, seeds, fatty fish, and olive oil. ? Low-fat or fat-free dairy products.  Check food labels, and avoid food and drinks that: ? Are high in calories. ? Have added sugar. ? Are high in sodium. ? Have saturated fats or trans fats.  Cook foods in healthier ways, such as by baking, broiling, or grilling.  Make a meal plan for the week, and shop with a grocery list to help you stay on track with your purchases. Try to avoid going to the grocery store when you are hungry.  When grocery shopping, try to shop around the outside of the store first, where the fresh foods are. Doing this helps you to avoid prepackaged foods, which can be high in sugar, salt (sodium), and fat. What lifestyle changes can be made?   Exercise for 30 or more minutes on 5 or more days each week. Exercising may include brisk walking, yard work, biking, running, swimming, and team sports like basketball and soccer. Ask your health care provider which exercises are safe for  you.  Do muscle-strengthening activities, such as lifting weights or using resistance bands, on 2 or more days a week.  Do not use any products that contain nicotine or tobacco, such as cigarettes and e-cigarettes. If you need help quitting, ask your health care provider.  Limit alcohol intake to no more than 1 drink a day for nonpregnant women and 2 drinks a day for men. One drink equals 12 oz of beer, 5 oz of wine, or 1 oz of hard liquor.  Try to get 7-9 hours of sleep each night. What other changes can be made?  Keep a food and activity journal to keep track of: ? What you ate and how many calories you had. Remember to count the calories in sauces,  dressings, and side dishes. ? Whether you were active, and what exercises you did. ? Your calorie, weight, and activity goals.  Check your weight regularly. Track any changes. If you notice you have gained weight, make changes to your diet or activity routine.  Avoid taking weight-loss medicines or supplements. Talk to your health care provider before starting any new medicine or supplement.  Talk to your health care provider before trying any new diet or exercise plan. Why are these changes important? Eating healthy, staying active, and having healthy habits can help you to prevent obesity. Those changes also:  Help you manage stress and emotions.  Help you connect with friends and family.  Improve your self-esteem.  Improve your sleep.  Prevent long-term health problems. What can happen if changes are not made? Being obese or overweight can cause you to develop joint or bone problems, which can make it hard for you to stay active or do activities you enjoy. Being obese or overweight also puts stress on your heart and lungs and can lead to health problems like diabetes, heart disease, and some cancers. Where to find more information Talk with your health care provider or a dietitian about healthy eating and healthy lifestyle  choices. You may also find information from:  U.S. Department of Agriculture, MyPlate: https://ball-collins.biz/www.choosemyplate.gov  American Heart Association: www.heart.org  Centers for Disease Control and Prevention: FootballExhibition.com.brwww.cdc.gov Summary  Staying at a healthy weight is important to your overall health. It helps you to prevent certain diseases and health problems, such as heart disease, diabetes, joint problems, sleep disorders, and some types of cancer.  Being obese or overweight can cause you to develop joint or bone problems, which can make it hard for you to stay active or do activities you enjoy.  You can prevent unhealthy weight gain by eating a healthy diet, exercising regularly, not smoking, limiting alcohol, and getting enough sleep.  Talk with your health care provider or a dietitian for guidance about healthy eating and healthy lifestyle choices. This information is not intended to replace advice given to you by your health care provider. Make sure you discuss any questions you have with your health care provider. Document Released: 05/15/2016 Document Revised: 02/22/2017 Document Reviewed: 06/20/2016 Elsevier Interactive Patient Education  2019 [employer]

## 2018-06-09 NOTE — Assessment & Plan Note (Signed)
Controlled without symptoms; limit salt more, follow DASH guidelines

## 2018-06-09 NOTE — Assessment & Plan Note (Signed)
Urged smoking cessation, risk of heart attack, stroke, many types of cancer

## 2018-06-27 ENCOUNTER — Other Ambulatory Visit: Payer: Self-pay | Admitting: Family Medicine

## 2018-06-27 NOTE — Telephone Encounter (Signed)
Lab Results  Component Value Date   CREATININE 0.82 02/07/2018   Lab Results  Component Value Date   K 4.1 02/07/2018

## 2018-06-29 ENCOUNTER — Other Ambulatory Visit: Payer: Self-pay | Admitting: Family Medicine

## 2018-06-30 NOTE — Telephone Encounter (Signed)
Lab Results  Component Value Date   CHOL 122 02/07/2018   HDL 50 (L) 02/07/2018   LDLCALC 57 02/07/2018   TRIG 69 02/07/2018   CHOLHDL 2.4 02/07/2018   Lab Results  Component Value Date   CREATININE 0.82 02/07/2018

## 2018-07-02 ENCOUNTER — Ambulatory Visit: Payer: BLUE CROSS/BLUE SHIELD | Admitting: Nurse Practitioner

## 2018-07-02 ENCOUNTER — Encounter: Payer: Self-pay | Admitting: Nurse Practitioner

## 2018-07-02 VITALS — BP 112/70 | HR 91 | Temp 98.4°F | Resp 16 | Ht 61.0 in | Wt 244.3 lb

## 2018-07-02 DIAGNOSIS — Z20828 Contact with and (suspected) exposure to other viral communicable diseases: Secondary | ICD-10-CM | POA: Diagnosis not present

## 2018-07-02 DIAGNOSIS — R6889 Other general symptoms and signs: Secondary | ICD-10-CM

## 2018-07-02 LAB — POCT INFLUENZA A/B
Influenza A, POC: NEGATIVE
Influenza B, POC: NEGATIVE

## 2018-07-02 MED ORDER — OSELTAMIVIR PHOSPHATE 75 MG PO CAPS: 75.0000 mg | ORAL_CAPSULE | Freq: Every day | ORAL | 0 refills | Status: DC

## 2018-07-02 MED ORDER — FLUTICASONE PROPIONATE 50 MCG/ACT NA SUSP: 2.0000 | Freq: Every day | NASAL | 6 refills | Status: DC

## 2018-07-02 MED ORDER — LORATADINE 10 MG PO TABS: 10.0000 mg | ORAL_TABLET | Freq: Every day | ORAL | 0 refills | Status: DC

## 2018-07-02 NOTE — Patient Instructions (Addendum)
-   Please take the tamiflu twice day for the next 5 days - For your runny nose and post nasal drip take a daily anti-histamine for the next week and then just as needed: examples: loratadine, cetrizine, xyzal OR if you want drowsy take benadryl - Flonase nasal spray 2 sprays in each nostril for 4 days, and then can go down to 1 spray in each nostril and then just as needed for nasal congestion and fullness - Rest, drink lots of water, good nutritious foods- with vitamins A, C, D3 and zinc, hot steamy showers - Wash your hands frequently, cover your cough, avoid areas with lots of people.    General Info:   For Fever/Pain: Acetaminophen every 6 hours as needed (maximum of 3000mg  a day). If you are still uncomfortable you can add ibuprofen OR naproxen  For coughing: try dextromethorphan for a cough suppressant, and/or a cool mist humidifier, lozenges  For sore throat: saline gargles, honey herbal tea, lozenges, throat spray  To dry out your nose: try an antihistamine like loratadine (non-sedating) or diphenhydramine (sedating) or others To relieve a stuffy nose: try an oral decongestant  Like pseudoephedrine if you are under the age of 6 and do not have high blood pressure, neti pot To make blowing your nose easier and relieve chest congestion: guaifenesin 400mg  every 4-6 hours of guaifenesin ER [phone] mg every 12 hours. Do not take more than 2,400mg  a day.

## 2018-07-02 NOTE — Progress Notes (Signed)
Name: Ariel Johnston   MRN: [id number]    DOB: 1960-09-06   Date:07/02/2018       Progress Note  Subjective  Chief Complaint  Chief Complaint  Patient presents with  . Laryngitis    for 6 days  . Nasal Congestion    yesterday - lots of post nasal drip.   . Facial Pain    lots of pressure  . Itchy Ears    patient stated that they have been itching very bad  . Sore Throat    patient woke up with sore throat - husband has the flu    HPI  Patient endorses hoarseness that started 6 days ago- states comes back with warm tea. Patients husband was diagnosed with the flu recently. States this morning wok up with sore throat, nasal congestion, facial pain, and bilateral ear itching, body aches, fatigue.   Denies shortness of breath or chest pain.   Patient Active Problem List   Diagnosis Date Noted  . Morbid obesity (HCC) 06/09/2018  . Encounter for surveillance of abnormal nevi 05/22/2018  . Microalbuminuria due to type 2 diabetes mellitus (HCC) 10/03/2017  . Elevated red blood cell count 12/13/2016  . Elevated hemoglobin (HCC) 12/13/2016  . Chronic pain of right hip 12/11/2016  . Elevated white blood cell count, unspecified 12/11/2016  . Dysphagia 04/17/2016  . Fatigue 04/17/2016  . Tobacco abuse 01/17/2016  . Medication monitoring encounter 01/17/2016  . Special screening for malignant neoplasms, colon   . Essential hypertension 01/20/2015  . Hyperlipidemia 01/20/2015  . 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 benign 11/03/2014  . Back ache 11/03/2014  . Clinical depression 11/03/2014  . Acid reflux 11/03/2014  . Gravida 0 11/03/2014  . Extreme obesity 11/03/2014  . Obstructive apnea 11/03/2014  . Polycystic ovaries 11/03/2014  . Allergic rhinitis, seasonal 11/03/2014  . Type II diabetes mellitus, uncontrolled (HCC) 03/08/2010  . Dyshidrosis 03/08/2010  . Combined fat and carbohydrate induced hyperlipemia 08/18/2008    Past Medical  History:  Diagnosis Date  . Depression   . Diabetes mellitus without complication (HCC)   . GERD (gastroesophageal reflux disease)   . Hyperlipidemia   . Hypertension   . Microalbuminuria due to type 2 diabetes mellitus (HCC) 10/03/2017  . Sleep apnea     Past Surgical History:  Procedure Laterality Date  . COLONOSCOPY WITH PROPOFOL N/A 12/06/2015   Procedure: COLONOSCOPY WITH PROPOFOL;  Surgeon: Midge Minium, MD;  Location: [facility] ENDOSCOPY;  Service: Endoscopy;  Laterality: N/A;  . DILATION AND CURETTAGE OF UTERUS  2007    Social History   Tobacco Use  . Smoking status: Current Every Day Smoker    Packs/day: 0.25    Years: 35.00    Pack years: 8.75    Types: Cigarettes  . Smokeless tobacco: Never Used  Substance Use Topics  . Alcohol use: No    Alcohol/week: 0.0 standard drinks     Current Outpatient Medications:  .  aspirin (ASPIRIN ADULT LOW DOSE) 81 MG EC tablet, Take 81 mg by mouth daily. , Disp: , Rfl:  .  atorvastatin (LIPITOR) 40 MG tablet, TAKE 1 TABLET (40 MG TOTAL) BY MOUTH AT BEDTIME., Disp: 90 tablet, Rfl: 1 .  Cholecalciferol (VITAMIN D PO), Take by mouth daily., Disp: , Rfl:  .  Cyanocobalamin (B-12) 1000 MCG CAPS, Take 1 capsule by mouth as needed., Disp: , Rfl:  .  dapagliflozin propanediol (FARXIGA) 10 MG TABS tablet, Take 10 mg by mouth daily., Disp:  90 tablet, Rfl: 3 .  glucose blood (ONE TOUCH ULTRA TEST) test strip, Use as instructed, check FSBS once a day; Dx E11.65, LON 99 months, Disp: 100 each, Rfl: 3 .  hydrochlorothiazide (HYDRODIURIL) 25 MG tablet, TAKE 1/2 TABLET (12.5 MG TOTAL) BY MOUTH DAILY, Disp: 45 tablet, Rfl: 1 .  losartan (COZAAR) 100 MG tablet, TAKE 1 TABLET (100 MG TOTAL) BY MOUTH DAILY., Disp: 90 tablet, Rfl: 1 .  metFORMIN (GLUCOPHAGE) 1000 MG tablet, , Disp: , Rfl:  .  TRULICITY 1.5 MG/0.5ML SOPN, Inject into the skin once a week., Disp: , Rfl:   Allergies  Allergen Reactions  . Actos [Pioglitazone]     bloating  . Victoza  [Liraglutide] Rash    rash    ROS   No other specific complaints in a complete review of systems (except as listed in HPI above).  Objective  Vitals:   07/02/18 0912  BP: 112/70  Pulse: 91  Resp: 16  Temp: 98.4 F (36.9 C)  TempSrc: Oral  SpO2: 95%  Weight: 244 lb 4.8 oz (110.8 kg)  Height: 5\' 1"  (1.549 m)    Body mass index is 46.16 kg/m.  Nursing Note and Vital Signs reviewed.  Physical Exam HENT:     Head: Normocephalic and atraumatic.     Right Ear: Hearing, tympanic membrane, ear canal and external ear normal.     Left Ear: Hearing, tympanic membrane, ear canal and external ear normal.     Nose:     Right Sinus: Maxillary sinus tenderness present. No frontal sinus tenderness.     Left Sinus: Maxillary sinus tenderness present. No frontal sinus tenderness.     Mouth/Throat:     Mouth: Mucous membranes are dry.     Pharynx: Uvula midline. Posterior oropharyngeal erythema present. No pharyngeal swelling, oropharyngeal exudate or uvula swelling.     Tonsils: No tonsillar exudate.  Eyes:     General:        Right eye: No discharge.        Left eye: No discharge.     Conjunctiva/sclera: Conjunctivae normal.  Neck:     Musculoskeletal: Normal range of motion.  Cardiovascular:     Rate and Rhythm: Normal rate.  Pulmonary:     Effort: Pulmonary effort is normal.     Breath sounds: Normal breath sounds.  Lymphadenopathy:     Cervical: No cervical adenopathy.  Skin:    General: Skin is warm and dry.     Findings: No rash.  Neurological:     Mental Status: She is alert.  Psychiatric:        Judgment: Judgment normal.        No results found for this or any previous visit (from the past 48 hour(s)).  Assessment & Plan  1. Flu-like symptoms Despite negative point-of-care flu test patient has positive contact and was complaining of sudden onset of body aches, fatigue, rhinorrhea, nasal congestion and sinus pressure.  Will treat with Tamiflu discussed OTC  management.  Discussed prevention of spread and prevention of secondary infections. - oseltamivir (TAMIFLU) 75 MG capsule; Take 1 capsule (75 mg total) by mouth daily.  Dispense: 10 capsule; Refill: 0 - fluticasone (FLONASE) 50 MCG/ACT nasal spray; Place 2 sprays into both nostrils daily.  Dispense: 16 g; Refill: 6 - loratadine (CLARITIN) 10 MG tablet; Take 1 tablet (10 mg total) by mouth daily.  Dispense: 30 tablet; Refill: 0  2. Exposure to the flu - POCT Influenza A/B - oseltamivir (  TAMIFLU) 75 MG capsule; Take 1 capsule (75 mg total) by mouth daily.  Dispense: 10 capsule; Refill: 0

## 2018-07-09 ENCOUNTER — Encounter: Payer: BLUE CROSS/BLUE SHIELD | Attending: Family Medicine | Admitting: Dietician

## 2018-07-09 ENCOUNTER — Ambulatory Visit
Admission: RE | Admit: 2018-07-09 | Discharge: 2018-07-09 | Disposition: A | Discharge status: Home / Self Care | Payer: BLUE CROSS/BLUE SHIELD | Source: Ambulatory Visit | Attending: Obstetrics & Gynecology | Admitting: Obstetrics & Gynecology

## 2018-07-09 ENCOUNTER — Encounter: Payer: Self-pay | Admitting: Dietician

## 2018-07-09 VITALS — Ht 61.0 in | Wt 249.1 lb

## 2018-07-09 DIAGNOSIS — Z8742 Personal history of other diseases of the female genital tract: Secondary | ICD-10-CM | POA: Diagnosis present

## 2018-07-09 DIAGNOSIS — Z6841 Body Mass Index (BMI) 40.0 and over, adult: Secondary | ICD-10-CM

## 2018-07-09 DIAGNOSIS — E119 Type 2 diabetes mellitus without complications: Secondary | ICD-10-CM

## 2018-07-09 DIAGNOSIS — E66813 Obesity, class 3: Secondary | ICD-10-CM

## 2018-07-09 NOTE — Patient Instructions (Signed)
   Plan to bring lunches to work some days.   Reduce carb intake with fast food meals, try side salad with a sandwich rather than fries or chips.   Try pre-cut and washed veggies, or frozen, along with pre-cooked or pre-cut fresh or frozen meats to save time.   Try 1-2 boiled eggs in the mornings with toast or small bowl of Cheerios, or frozen [employer] sandwiches + fruit.

## 2018-07-09 NOTE — Progress Notes (Signed)
Medical Nutrition Therapy: Visit start time: 1650  end time: 1750 Assessment:  Diagnosis: obesity, Type 2 diabetes Past medical history: HTN, HLD, sleep apnea Psychosocial issues/ stress concerns: patient reports high stress level  Preferred learning method:  Jill Alexanders . Hands-on   Current weight: 249.1lbs with shoes  Height: 5'1" Medications, supplements: reconciled list in medical record  Progress and evaluation: Patient reports unhealthy eating habits currently, mostly due to busy schedule. 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 She started weight watchers about 2 years ago, did will in the beginning but has drifted away from the plan. She reports increase in blood sugars, cholesterol, and weight in recent months with decline in dietary health value. Patient feels she is a picky eater, and has some food aversions since childhood, but has been trying some new foods recently.    Physical activity: none  Dietary Intake:  Usual eating pattern includes 2-3 meals and 1-2 snacks per day. Dining out frequency: 13-18 meals per week.  Breakfast: started eating breakfast recently -- fast food biscuit sandwich Snack: none Lunch: fast foods -- burger and fries;  pizza; cafeteria at work closed due to remodeling.  Snack: Chips, cookies, candy -- usually sugar free versions Supper: often out ie bojangles; husband sometimes cooks. Recently ate asparagus and enjoyed it, also occasionally eats salmon.  Snack: same as pm  Beverages: diet soda, diet cran-pineapple juice mixed with soda, not much water.  Nutrition Care Education: Topics covered: weight control, diabetes Basic nutrition: basic food groups, appropriate nutrient balance, appropriate meal and snack schedule, general nutrition guidelines    Weight control: reducing fat and sugar intake; appropriate food portions, options for restaurant meals; basic  meal planning using plate method and guidance for about 1300kcal daily intake (45% CHO, 25% protein, 30% fat).  Diabetes:  appropriate meal and snack schedule, appropriate carb intake and balance, healthy carb choices, options for quick and balanced meals and food choices   Nutritional Diagnosis:  [place]-2.2 Altered nutrition-related laboratory As related to type 2 diabetes.  As evidenced by patient with recent HbA1C of 7.6%. Atkins-3.3 Overweight/obesity As related to excess calories, inactivity, stress.  As evidenced by patient with current BMI of 47, and patient report of diet and lifestyle history.  Intervention:   Instruction and discussion as noted above.  Patient has some basic nutrition understanding and will be able to work on some concrete diet improvements.   Established goals with direction from patient.    Education Materials given:  . Plate Planner wit food lists . Dining out resource . Sample meal pattern/ menus . Goals/ instructions   Learner/ who was taught:  . Patient   Level of understanding: Marland Kitchen Verbalizes/ demonstrates competency   Demonstrated degree of understanding via:   Teach back Learning barriers: . None  Willingness to learn/ readiness for change: . Acceptance, ready for change   Monitoring and Evaluation:  Dietary intake, exercise, BG control, and body weight      follow up: 08/20/18

## 2018-07-14 ENCOUNTER — Ambulatory Visit: Payer: BLUE CROSS/BLUE SHIELD | Admitting: Family Medicine

## 2018-07-15 ENCOUNTER — Ambulatory Visit: Payer: BLUE CROSS/BLUE SHIELD | Admitting: Family Medicine

## 2018-07-24 ENCOUNTER — Other Ambulatory Visit: Payer: Self-pay | Admitting: Nurse Practitioner

## 2018-07-24 DIAGNOSIS — R6889 Other general symptoms and signs: Secondary | ICD-10-CM

## 2018-07-30 ENCOUNTER — Ambulatory Visit: Payer: BLUE CROSS/BLUE SHIELD | Admitting: Obstetrics & Gynecology

## 2018-08-20 ENCOUNTER — Ambulatory Visit: Payer: BLUE CROSS/BLUE SHIELD | Admitting: Dietician

## 2018-09-01 ENCOUNTER — Encounter: Payer: Self-pay | Admitting: Dietician

## 2018-09-01 NOTE — Progress Notes (Signed)
Sent letter to referring provider regarding patient's cancelled appointment from 08/20/18. Will attempt to reach patient to reschedule after Covid 19 crisis has passed.

## 2018-09-29 ENCOUNTER — Ambulatory Visit: Payer: BLUE CROSS/BLUE SHIELD | Admitting: Family Medicine

## 2018-12-09 ENCOUNTER — Other Ambulatory Visit: Payer: Self-pay

## 2018-12-09 ENCOUNTER — Encounter: Payer: Self-pay | Admitting: Nurse Practitioner

## 2018-12-09 ENCOUNTER — Ambulatory Visit (INDEPENDENT_AMBULATORY_CARE_PROVIDER_SITE_OTHER): Payer: BC Managed Care – PPO | Admitting: Nurse Practitioner

## 2018-12-09 VITALS — Resp 16

## 2018-12-09 DIAGNOSIS — I10 Essential (primary) hypertension: Secondary | ICD-10-CM | POA: Diagnosis not present

## 2018-12-09 DIAGNOSIS — G4733 Obstructive sleep apnea (adult) (pediatric): Secondary | ICD-10-CM

## 2018-12-09 DIAGNOSIS — Z5181 Encounter for therapeutic drug level monitoring: Secondary | ICD-10-CM

## 2018-12-09 DIAGNOSIS — K219 Gastro-esophageal reflux disease without esophagitis: Secondary | ICD-10-CM

## 2018-12-09 DIAGNOSIS — R809 Proteinuria, unspecified: Secondary | ICD-10-CM

## 2018-12-09 DIAGNOSIS — E1165 Type 2 diabetes mellitus with hyperglycemia: Secondary | ICD-10-CM | POA: Diagnosis not present

## 2018-12-09 DIAGNOSIS — E782 Mixed hyperlipidemia: Secondary | ICD-10-CM

## 2018-12-09 DIAGNOSIS — D582 Other hemoglobinopathies: Secondary | ICD-10-CM

## 2018-12-09 DIAGNOSIS — E1129 Type 2 diabetes mellitus with other diabetic kidney complication: Secondary | ICD-10-CM

## 2018-12-09 MED ORDER — FARXIGA 10 MG PO TABS: 10.0000 mg | ORAL_TABLET | Freq: Every day | ORAL | 0 refills | Status: DC

## 2018-12-09 MED ORDER — LOSARTAN POTASSIUM 100 MG PO TABS: 100.0000 mg | ORAL_TABLET | Freq: Every day | ORAL | 1 refills | Status: AC

## 2018-12-09 MED ORDER — ATORVASTATIN CALCIUM 40 MG PO TABS: 40.0000 mg | ORAL_TABLET | Freq: Every day | ORAL | 1 refills | Status: AC

## 2018-12-09 MED ORDER — HYDROCHLOROTHIAZIDE 25 MG PO TABS: 25.0000 mg | ORAL_TABLET | Freq: Every day | ORAL | 1 refills | Status: DC

## 2018-12-09 NOTE — Progress Notes (Signed)
Virtual Visit via Video Note  I connected with [place] on 12/09/18 at  2:20 PM EDT by a video enabled telemedicine application and verified that I am speaking with the correct person using two identifiers.   Staff discussed the limitations of evaluation and management by telemedicine and the availability of in person appointments. The patient expressed understanding and agreed to proceed.  Patient location: home  My location: work office Other people present:  none HPI  Hypertension Patient is on HCTZ 12.5mg  daily, losartan 100mg  daily.  Takes medications as prescribed with no missed doses a month.  She is not compliant with low-salt diet.  Does not check blood pressures at home.  Denies chest pain, headaches, blurry vision. BP Readings from Last 3 Encounters:  07/02/18 112/70  06/09/18 112/64  05/22/18 100/60    Hyperlipidemia Patient rx welchol & atorvastatin 40mg   Takes atorvastatin as prescribed- she occasionally does the welchol because she forgets most of the time.  Diet: [employer] foods - occasionally; mostly baking but does eat a lot of red meats- but typically grills it. Eats at least 4-7 servings of vegetables a week.  Denies myalgias Lab Results  Component Value Date   CHOL 122 02/07/2018   HDL 50 (L) 02/07/2018   LDLCALC 57 02/07/2018   TRIG 69 02/07/2018   CHOLHDL 2.4 02/07/2018    Diabetes Mellitus Patient is rx metformin 1000 mg BID, farxiga 10mg  daily, trulicity 1.5mg  weekly and recently increased levemir to 30 units in the evening. Takes medications as prescribed with no missed doses a month.  Diet: drinks a lot of diet mountain dew.  Checks blood sugars, Fasting sugars in the last week have gone from 128-140.  Denies polyphagia, polydipsia, polyuria.  Last a1c was 09/18/2018- 9.8%   OSA Not using CPAP right now due to issues with CPAP but will wear it again.  She is still smoking 0.5-1ppd, she is not interested in quitting at this time.    PHQ2/9: Depression screen [facility] 2/9 12/09/2018 07/09/2018 07/02/2018 06/09/2018 02/07/2018  Decreased Interest 0 0 0 0 0  Down, Depressed, Hopeless 0 0 0 0 0  PHQ - 2 Score 0 0 0 0 0  Altered sleeping 0 - 0 0 -  Tired, decreased energy 0 - 0 0 -  Change in appetite 0 - 0 0 -  Feeling bad or failure about yourself  0 - 0 0 -  Trouble concentrating 0 - 0 0 -  Moving slowly or fidgety/restless 0 - 0 0 -  Suicidal thoughts 0 - 0 0 -  PHQ-9 Score 0 - 0 0 -  Difficult doing work/chores Not difficult at all - Not difficult at all Not difficult at all -     PHQ reviewed. Negative  Patient Active Problem List   Diagnosis Date Noted  . Morbid obesity ([place]) 06/09/2018  . Microalbuminuria due to type 2 diabetes mellitus ([place]) 10/03/2017  . Elevated hemoglobin ([place]) 12/13/2016  . Chronic pain of right hip 12/11/2016  . Dysphagia 04/17/2016  . Tobacco abuse 01/17/2016  . Special screening for malignant neoplasms, colon   . Essential hypertension 01/20/2015  . Hyperlipidemia 01/20/2015  . 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 benign 11/03/2014  . Back ache 11/03/2014  . Clinical depression 11/03/2014  . Acid reflux 11/03/2014  . Obstructive apnea 11/03/2014  . Polycystic ovaries 11/03/2014  . Allergic rhinitis, seasonal 11/03/2014  . Type II diabetes mellitus, uncontrolled ([place]) 03/08/2010  . Dyshidrosis 03/08/2010  .  Combined fat and carbohydrate induced hyperlipemia 08/18/2008    Past Medical History:  Diagnosis Date  . Depression   . Diabetes mellitus without complication (HCC)   . GERD (gastroesophageal reflux disease)   . Hyperlipidemia   . Hypertension   . Microalbuminuria due to type 2 diabetes mellitus (HCC) 10/03/2017  . Sleep apnea     Past Surgical History:  Procedure Laterality Date  . COLONOSCOPY WITH PROPOFOL N/A 12/06/2015   Procedure: COLONOSCOPY WITH PROPOFOL;  Surgeon: Midge Miniumarren Wohl, MD;  Location: [facility] ENDOSCOPY;  Service:  Endoscopy;  Laterality: N/A;  . DILATION AND CURETTAGE OF UTERUS  2007    Social History   Tobacco Use  . Smoking status: Current Every Day Smoker    Packs/day: 0.50    Years: 35.00    Pack years: 17.50    Types: Cigarettes  . Smokeless tobacco: Never Used  Substance Use Topics  . Alcohol use: No    Alcohol/week: 0.0 standard drinks     Current Outpatient Medications:  .  aspirin (ASPIRIN ADULT LOW DOSE) 81 MG EC tablet, Take 81 mg by mouth daily. , Disp: , Rfl:  .  atorvastatin (LIPITOR) 40 MG tablet, Take 1 tablet (40 mg total) by mouth at bedtime., Disp: 90 tablet, Rfl: 1 .  Cholecalciferol (VITAMIN D PO), Take by mouth daily., Disp: , Rfl:  .  Cyanocobalamin (B-12) 1000 MCG CAPS, Take 1 capsule by mouth as needed., Disp: , Rfl:  .  dapagliflozin propanediol (FARXIGA) 10 MG TABS tablet, Take 10 mg by mouth daily., Disp: 90 tablet, Rfl: 0 .  hydrochlorothiazide (HYDRODIURIL) 25 MG tablet, Take 1 tablet (25 mg total) by mouth daily., Disp: 45 tablet, Rfl: 1 .  LEVEMIR FLEXTOUCH 100 UNIT/ML Pen, Inject 30 Units into the skin daily. , Disp: , Rfl:  .  loratadine (CLARITIN) 10 MG tablet, TAKE 1 TABLET BY MOUTH EVERY DAY, Disp: 30 tablet, Rfl: 11 .  losartan (COZAAR) 100 MG tablet, Take 1 tablet (100 mg total) by mouth daily., Disp: 90 tablet, Rfl: 1 .  metFORMIN (GLUCOPHAGE) 1000 MG tablet, , Disp: , Rfl:  .  TRULICITY 1.5 MG/0.5ML SOPN, Inject into the skin once a week., Disp: , Rfl:   Allergies  Allergen Reactions  . Actos [Pioglitazone]     bloating  . Victoza [Liraglutide] Rash    rash    ROS   No other specific complaints in a complete review of systems (except as listed in HPI above).  Objective  Vitals:   12/09/18 1456  Resp: 16     There is no height or weight on file to calculate BMI.  Nursing Note and Vital Signs reviewed.  Physical Exam  Constitutional: Patient appears well-developed and well-nourished. No distress.  HENT: Head: Normocephalic and  atraumatic. Pulmonary/Chest: Effort normal  Neurological: alert and oriented, speech normal.  Psychiatric: Patient has a normal mood and affect. behavior is normal. Judgment and thought content normal.    Assessment & Plan 1. Essential hypertension Stable, dash  - losartan (COZAAR) 100 MG tablet; Take 1 tablet (100 mg total) by mouth daily.  Dispense: 90 tablet; Refill: 1 - hydrochlorothiazide (HYDRODIURIL) 25 MG tablet; Take 1 tablet (25 mg total) by mouth daily.  Dispense: 45 tablet; Refill: 1  2. Obstructive apnea Please restart CPAP   3. Gastroesophageal reflux disease, esophagitis presence not specified Avoid triggers   4. Uncontrolled type 2 diabetes mellitus with hyperglycemia (HCC) Follow-up with endo, future farxiga will come from endo - losartan (COZAAR)  100 MG tablet; Take 1 tablet (100 mg total) by mouth daily.  Dispense: 90 tablet; Refill: 1 - HgB A1c - dapagliflozin propanediol (FARXIGA) 10 MG TABS tablet; Take 10 mg by mouth daily.  Dispense: 90 tablet; Refill: 0  5. Microalbuminuria due to type 2 diabetes mellitus (HCC) Routinely monitor, on ARB for nephroprotection   6. Mixed hyperlipidemia stop welchol as she is not taking it, recheck levels  - atorvastatin (LIPITOR) 40 MG tablet; Take 1 tablet (40 mg total) by mouth at bedtime.  Dispense: 90 tablet; Refill: 1 - Lipid Profile  7. Elevated hemoglobin (HCC) - CBC with Differential  8. Morbid obesity (HCC) Discussed diet.   9. Medication monitoring encounter - COMPLETE METABOLIC PANEL WITH GFR    Follow Up Instructions:  3 month CPE, 6 month routine.    I discussed the assessment and treatment plan with the patient. The patient was provided an opportunity to ask questions and all were answered. The patient agreed with the plan and demonstrated an understanding of the instructions.   The patient was advised to call back or seek an in-person evaluation if the symptoms worsen or if the condition fails to  improve as anticipated.  I provided 21 minutes of non-face-to-face time during this encounter.   Cheryle HorsfallElizabeth E Arna Luis, NP

## 2018-12-25 ENCOUNTER — Other Ambulatory Visit: Payer: Self-pay | Admitting: Family Medicine

## 2018-12-25 DIAGNOSIS — I10 Essential (primary) hypertension: Secondary | ICD-10-CM

## 2019-01-21 ENCOUNTER — Encounter: Payer: Self-pay | Admitting: Family Medicine

## 2019-02-21 IMAGING — MG DIGITAL SCREENING BILATERAL MAMMOGRAM WITH TOMO AND CAD
8 series · 8 of 24 positions shown · non-contrast
Comparison: Previous exam(s).

CLINICAL DATA: Screening.

EXAM:
DIGITAL SCREENING BILATERAL MAMMOGRAM WITH TOMO AND CAD

[R MLO synth-2D]
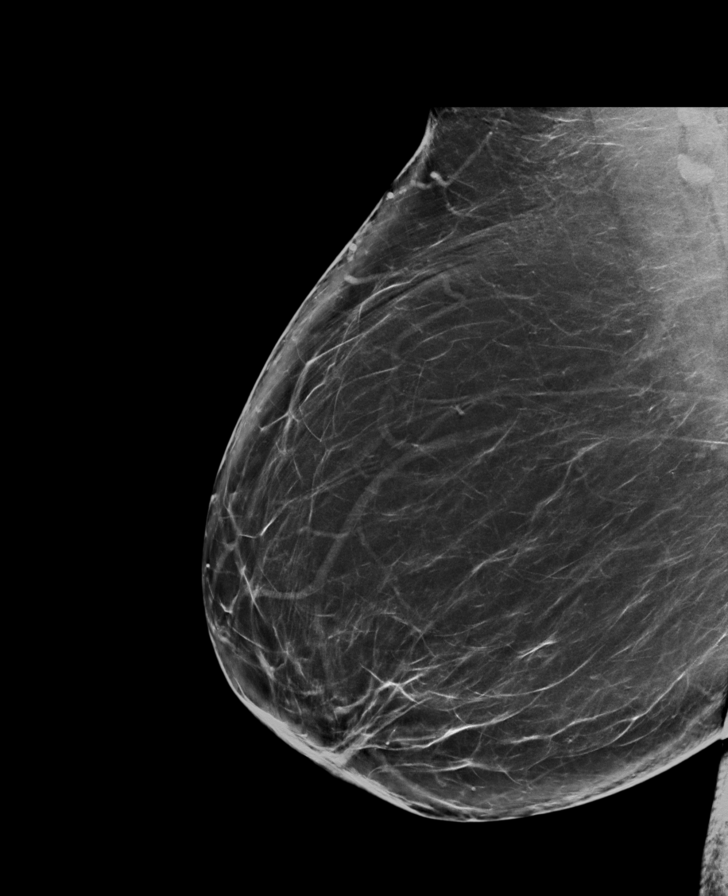

[R CC synth-2D]
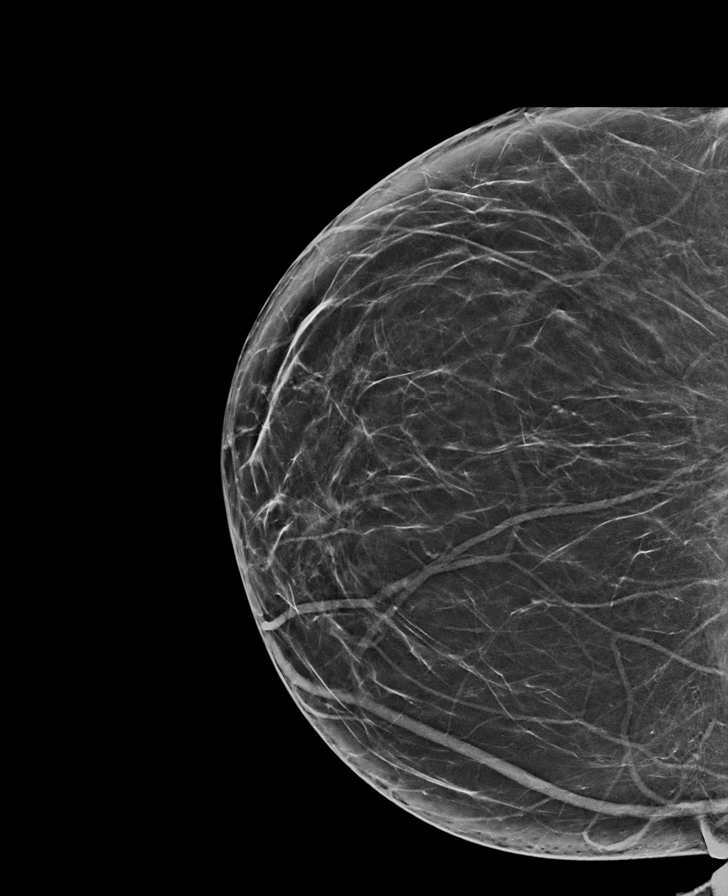

[L MLO synth-2D]
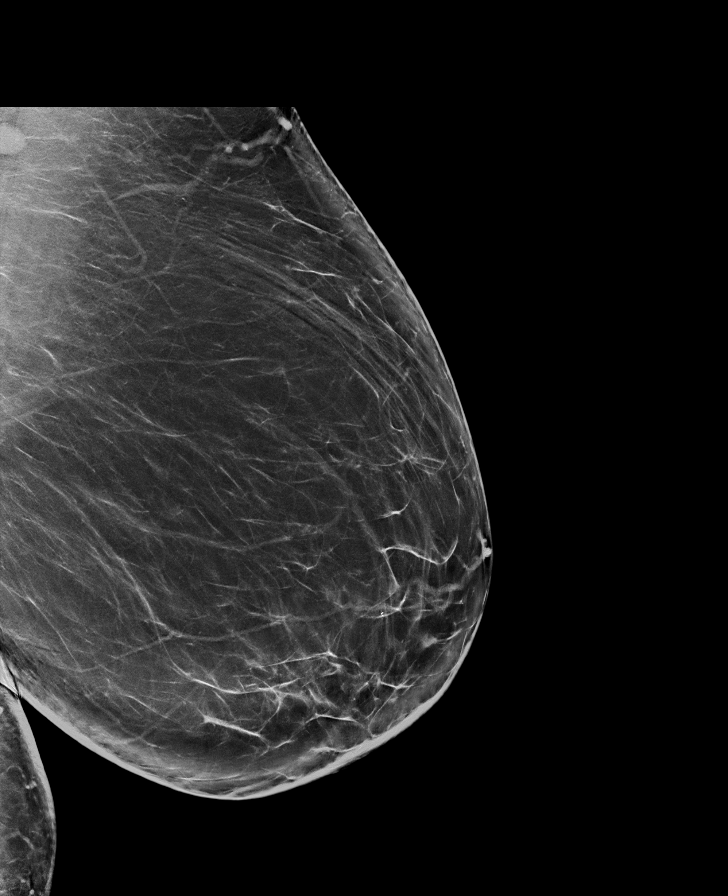

[L CC synth-2D]
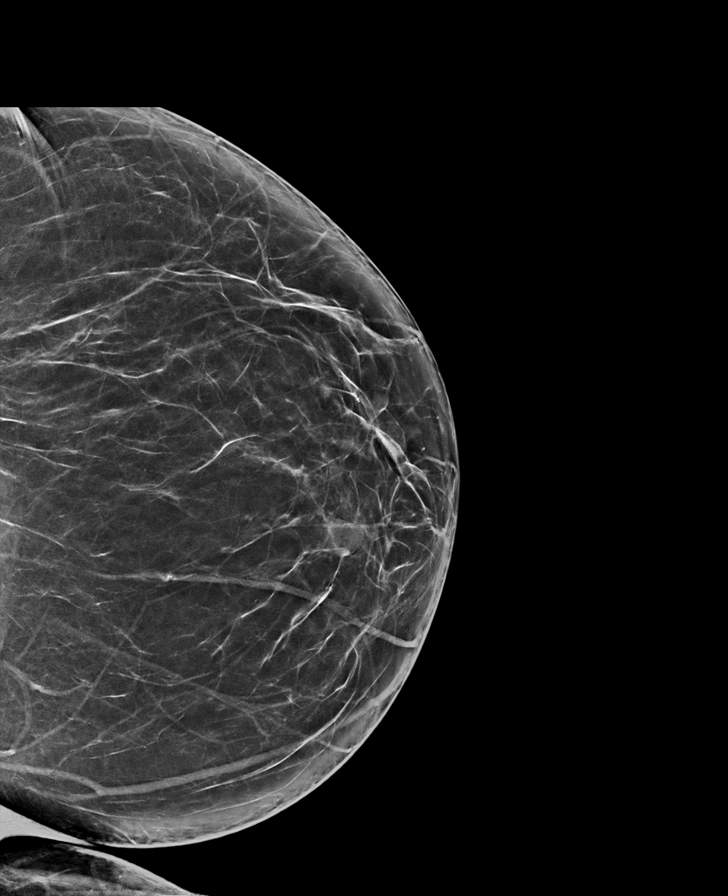

[L CC tomo · tomo slice 38/75.0]
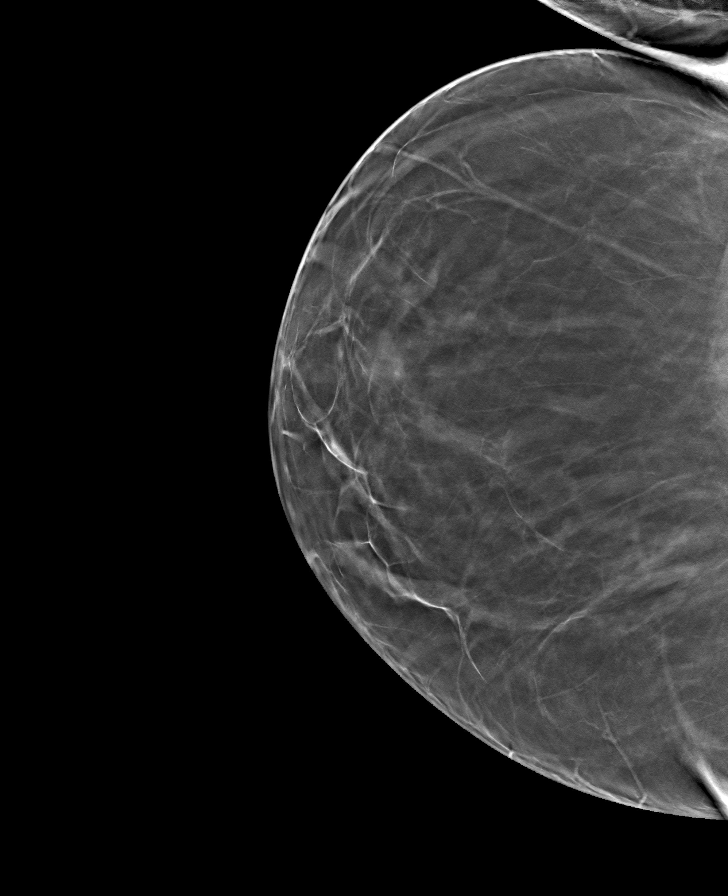

[R MLO tomo · tomo slice 47/93.0]
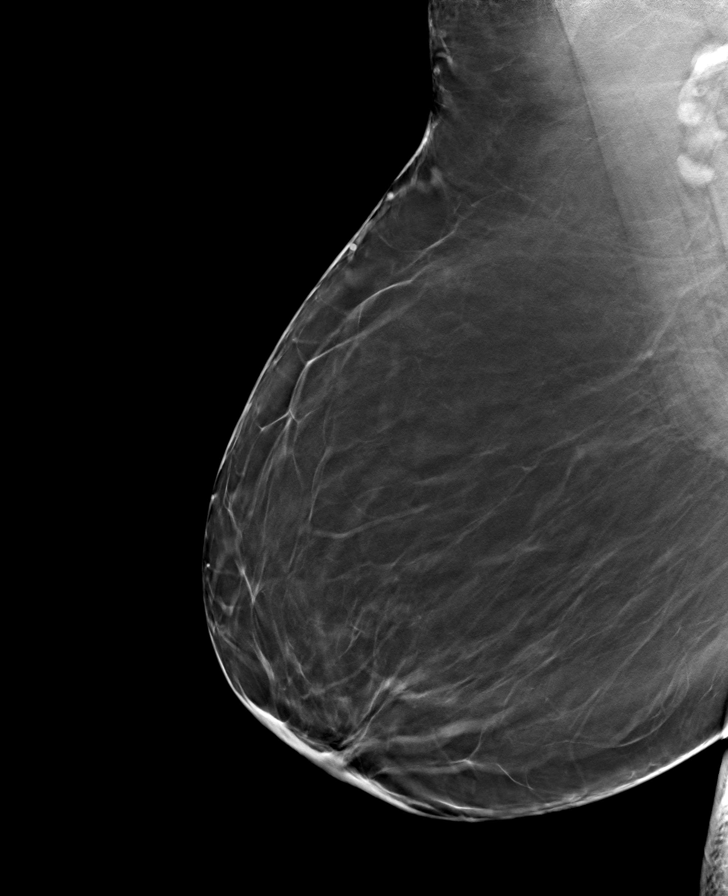

[L MLO tomo · tomo slice 47/93.0]
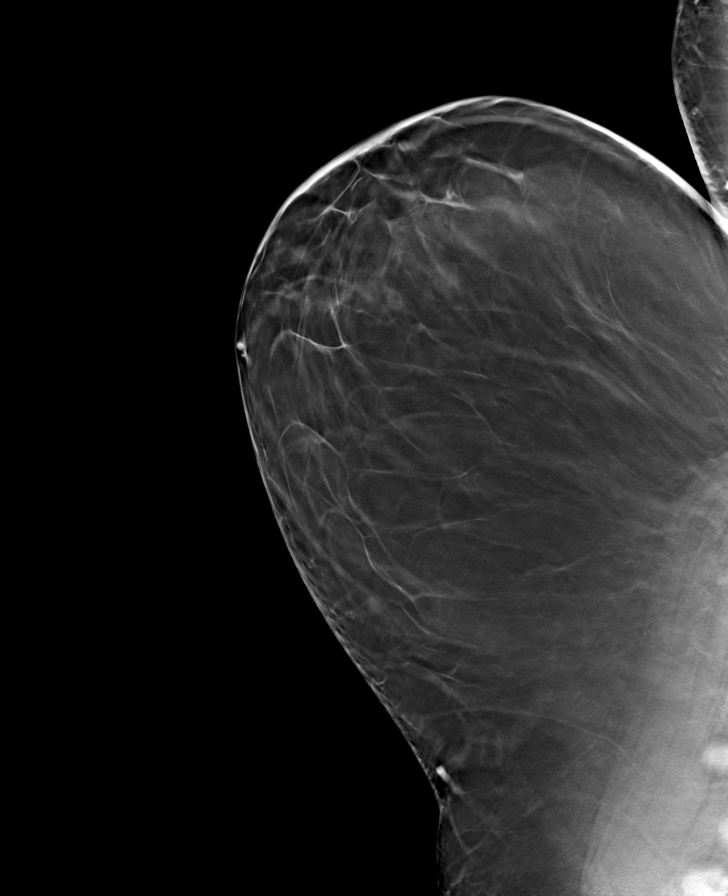

[R CC tomo · tomo slice 37/72.0]
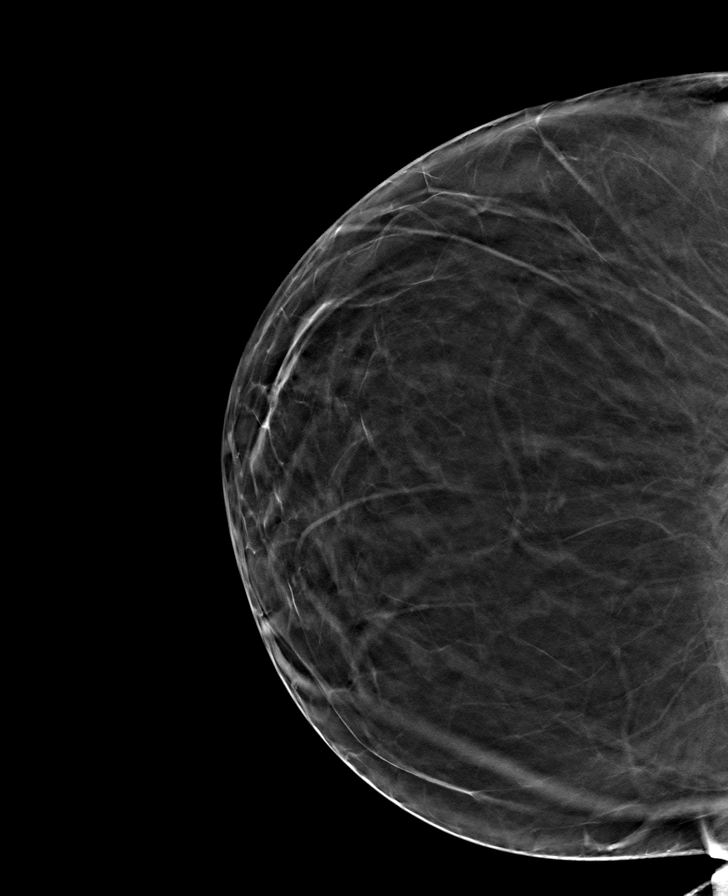

[8 of 24 positions shown; findings below may reference images not displayed]

ACR Breast Density Category b: There are scattered areas of
fibroglandular density.
FINDINGS: There are no findings suspicious for malignancy. Images were
processed with CAD.
IMPRESSION: No mammographic evidence of malignancy. A result letter of this
screening mammogram will be mailed directly to the patient.

RECOMMENDATION:
Screening mammogram in one year. (Code:[id number])

BI-RADS CATEGORY  1: Negative.

## 2019-03-02 ENCOUNTER — Telehealth: Payer: Self-pay

## 2019-03-02 DIAGNOSIS — E1165 Type 2 diabetes mellitus with hyperglycemia: Secondary | ICD-10-CM

## 2019-03-03 MED ORDER — FARXIGA 10 MG PO TABS: 10.0000 mg | ORAL_TABLET | Freq: Every day | ORAL | 0 refills | Status: DC

## 2019-03-03 NOTE — Telephone Encounter (Signed)
Please call to remind patient to come in for labs that were ordered in July by Benjamine Mola.

## 2019-03-03 NOTE — Telephone Encounter (Signed)
Called pt to remind her of needing labs and she said that she is no longer coming here. She has switched doctors.

## 2019-03-13 ENCOUNTER — Encounter: Payer: BC Managed Care – PPO | Admitting: Family Medicine

## 2019-04-02 ENCOUNTER — Other Ambulatory Visit: Payer: Self-pay | Admitting: Obstetrics & Gynecology

## 2019-04-02 DIAGNOSIS — Z1231 Encounter for screening mammogram for malignant neoplasm of breast: Secondary | ICD-10-CM

## 2019-05-12 ENCOUNTER — Telehealth: Payer: Self-pay

## 2019-05-12 DIAGNOSIS — I10 Essential (primary) hypertension: Secondary | ICD-10-CM

## 2019-05-12 MED ORDER — HYDROCHLOROTHIAZIDE 25 MG PO TABS: 25.0000 mg | ORAL_TABLET | Freq: Every day | ORAL | 0 refills | Status: AC

## 2019-05-12 NOTE — Telephone Encounter (Signed)
Please schedule patient for follow up in the next 30 days.  

## 2019-05-13 NOTE — Telephone Encounter (Signed)
Called pt to get her scheduled and she states she found a new PCP

## 2019-05-25 ENCOUNTER — Other Ambulatory Visit: Payer: Self-pay

## 2019-05-25 ENCOUNTER — Other Ambulatory Visit: Payer: Self-pay | Admitting: Obstetrics & Gynecology

## 2019-05-25 ENCOUNTER — Ambulatory Visit (INDEPENDENT_AMBULATORY_CARE_PROVIDER_SITE_OTHER): Payer: BC Managed Care – PPO | Admitting: Obstetrics & Gynecology

## 2019-05-25 ENCOUNTER — Encounter: Payer: Self-pay | Admitting: Obstetrics & Gynecology

## 2019-05-25 VITALS — BP 120/80 | Ht 62.0 in | Wt 251.0 lb

## 2019-05-25 DIAGNOSIS — R5383 Other fatigue: Secondary | ICD-10-CM

## 2019-05-25 DIAGNOSIS — Z1211 Encounter for screening for malignant neoplasm of colon: Secondary | ICD-10-CM

## 2019-05-25 DIAGNOSIS — Z01419 Encounter for gynecological examination (general) (routine) without abnormal findings: Secondary | ICD-10-CM | POA: Diagnosis not present

## 2019-05-25 MED ORDER — PHENTERMINE HCL 37.5 MG PO TABS: ORAL_TABLET | ORAL | 0 refills | Status: DC

## 2019-05-25 MED ORDER — CYANOCOBALAMIN 1000 MCG/ML IJ SOLN: 1000.0000 ug | Freq: Once | INTRAMUSCULAR | 0 refills | Status: DC

## 2019-05-25 NOTE — Progress Notes (Signed)
HPI:      [name] is a 58 y.o. G0P0000 who LMP was in the past, she presents today for her annual examination.  The patient has no complaints today other than FATIGUE and WEIGHT GAIN and inadequate loss w other measures such as weight watchers. The patient is sexually active. Herlast pap: approximate date 2019 and was normal and last mammogram: approximate date 05/2018 and was normal.  The patient does perform self breast exams.  There is no notable family history of breast or ovarian cancer in her family. The patient is not taking hormone replacement therapy. Patient denies post-menopausal vaginal bleeding.   The patient has regular exercise: no. The patient denies current symptoms of depression.    GYN Hx: Last Colonoscopy:4 years ago. Normal.   PMHx: Past Medical History:  Diagnosis Date  . Depression   . Diabetes mellitus without complication (HCC)   . GERD (gastroesophageal reflux disease)   . Hyperlipidemia   . Hypertension   . Microalbuminuria due to type 2 diabetes mellitus (HCC) 10/03/2017  . Sleep apnea    Past Surgical History:  Procedure Laterality Date  . COLONOSCOPY WITH PROPOFOL N/A 12/06/2015   Procedure: COLONOSCOPY WITH PROPOFOL;  Surgeon: Midge Minium, MD;  Location: [facility] ENDOSCOPY;  Service: Endoscopy;  Laterality: N/A;  . DILATION AND CURETTAGE OF UTERUS  2007   Family History  Problem Relation Age of Onset  . Hypertension Brother   . Kidney disease Brother   . Stroke Father   . Hypertension Maternal Grandmother   . Stroke Maternal Grandmother   . Cancer Paternal Grandmother        colon  . Diabetes Paternal Grandfather   . Heart disease Paternal Grandfather   . Hypertension Paternal Grandfather   . Breast cancer Neg Hx   . Ovarian cancer Neg Hx    Social History   Tobacco Use  . Smoking status: Current Every Day Smoker    Packs/day: 0.50    Years: 35.00    Pack years: 17.50    Types: Cigarettes  . Smokeless tobacco: Never Used    Substance Use Topics  . Alcohol use: No    Alcohol/week: 0.0 standard drinks  . Drug use: No    Current Outpatient Medications:  .  aspirin (ASPIRIN ADULT LOW DOSE) 81 MG EC tablet, Take 81 mg by mouth daily. , Disp: , Rfl:  .  atorvastatin (LIPITOR) 40 MG tablet, Take 1 tablet (40 mg total) by mouth at bedtime., Disp: 90 tablet, Rfl: 1 .  Cholecalciferol (VITAMIN D PO), Take by mouth daily., Disp: , Rfl:  .  Cyanocobalamin (B-12) 1000 MCG CAPS, Take 1 capsule by mouth as needed., Disp: , Rfl:  .  dapagliflozin propanediol (FARXIGA) 10 MG TABS tablet, Take 10 mg by mouth daily., Disp: 90 tablet, Rfl: 0 .  hydrochlorothiazide (HYDRODIURIL) 25 MG tablet, Take 1 tablet (25 mg total) by mouth daily., Disp: 30 tablet, Rfl: 0 .  LEVEMIR FLEXTOUCH 100 UNIT/ML Pen, Inject 30 Units into the skin daily. , Disp: , Rfl:  .  losartan (COZAAR) 100 MG tablet, Take 1 tablet (100 mg total) by mouth daily., Disp: 90 tablet, Rfl: 1 .  metFORMIN (GLUCOPHAGE) 1000 MG tablet, , Disp: , Rfl:  .  TRULICITY 1.5 MG/0.5ML SOPN, Inject into the skin once a week., Disp: , Rfl:  .  cyanocobalamin (,VITAMIN B-12,) 1000 MCG/ML injection, Inject 1 mL (1,000 mcg total) into the muscle once for 1 dose., Disp: 1 mL, Rfl:  0 .  loratadine (CLARITIN) 10 MG tablet, TAKE 1 TABLET BY MOUTH EVERY DAY (Patient not taking: Reported on 05/25/2019), Disp: 30 tablet, Rfl: 11 .  phentermine (ADIPEX-P) 37.5 MG tablet, One tablet po in morning., Disp: 30 tablet, Rfl: 0 Allergies: Actos [pioglitazone] and Victoza [liraglutide]  Review of Systems  Constitutional: Negative for chills, fever and malaise/fatigue.  HENT: Negative for congestion, sinus pain and sore throat.   Eyes: Negative for blurred vision and pain.  Respiratory: Negative for cough and wheezing.   Cardiovascular: Negative for chest pain and leg swelling.  Gastrointestinal: Negative for abdominal pain, constipation, diarrhea, heartburn, nausea and vomiting.  Genitourinary:  Negative for dysuria, frequency, hematuria and urgency.  Musculoskeletal: Negative for back pain, joint pain, myalgias and neck pain.  Skin: Negative for itching and rash.  Neurological: Negative for dizziness, tremors and weakness.  Endo/Heme/Allergies: Does not bruise/bleed easily.  Psychiatric/Behavioral: Negative for depression. The patient is not nervous/anxious and does not have insomnia.     Objective: BP 120/80   Ht 5\' 2"  (1.575 m)   Wt 251 lb (113.9 kg)   BMI 45.91 kg/m   Filed Weights   05/25/19 1322  Weight: 251 lb (113.9 kg)   Body mass index is 45.91 kg/m. Physical Exam Constitutional:      General: She is not in acute distress.    Appearance: She is well-developed.  Genitourinary:     Pelvic exam was performed with patient supine.     Vagina, uterus and rectum normal.     No lesions in the vagina.     No vaginal bleeding.     No cervical motion tenderness, friability, lesion or polyp.     Uterus is mobile.     Uterus is not enlarged.     No uterine mass detected.    Uterus is midaxial.     No right or left adnexal mass present.     Right adnexa not tender.     Left adnexa not tender.  HENT:     Head: Normocephalic and atraumatic. No laceration.     Right Ear: Hearing normal.     Left Ear: Hearing normal.     Mouth/Throat:     Pharynx: Uvula midline.  Eyes:     Pupils: Pupils are equal, round, and reactive to light.  Neck:     Thyroid: No thyromegaly.  Cardiovascular:     Rate and Rhythm: Normal rate and regular rhythm.     Heart sounds: No murmur. No friction rub. No gallop.   Pulmonary:     Effort: Pulmonary effort is normal. No respiratory distress.     Breath sounds: Normal breath sounds. No wheezing.  Chest:     Breasts:        Right: No mass, skin change or tenderness.        Left: No mass, skin change or tenderness.  Abdominal:     General: Bowel sounds are normal. There is no distension.     Palpations: Abdomen is soft.     Tenderness:  There is no abdominal tenderness. There is no rebound.  Musculoskeletal:        General: Normal range of motion.     Cervical back: Normal range of motion and neck supple.  Neurological:     Mental Status: She is alert and oriented to person, place, and time.     Cranial Nerves: No cranial nerve deficit.  Skin:    General: Skin is warm and dry.  Psychiatric:        Judgment: Judgment normal.  Vitals reviewed.     Assessment: Annual Exam 1. Women's annual routine gynecological examination   2. Screen for colon cancer   3. Obesity, morbid, BMI 40.0-49.9 (HCC)   4. Fatigue, unspecified type     Plan:            1.  Cervical Screening-  Pap smear schedule reviewed with patient  2. Breast screening- Exam annually and mammogram scheduled  3. Colonoscopy every 10 years, Hemoccult testing after age 57  4. Labs Ordered today  5. Counseling for hormonal therapy: none              6. FRAX - FRAX score for assessing the 10 year probability for fracture calculated and discussed today.  Based on age and score today, DEXA is not currently scheduled.   7. Obesity.  BMI 45. Options discussed.  Needs to increase exercise and limit eating late at night, more water.  Phentermine discussed as option as well; Rx and follow up arranged.  B12 injections as has not responded to po therapy well.  TSH level today.    F/U  Return in about 1 year (around 05/24/2020) for Annual.  Annamarie MajorPaul Marlita Keil, MD, Merlinda FrederickFACOG [facility], [facility] 05/25/2019  1:57 PM

## 2019-05-25 NOTE — Patient Instructions (Addendum)
PAP every three years Mammogram every year    Call [phone] to schedule at [facility] Colonoscopy every 10 years Labs yearly (with PCP)  Phentermine tablets or capsules What is this medicine? PHENTERMINE (FEN ter meen) decreases your appetite. It is used with a reduced calorie diet and exercise to help you lose weight. This medicine may be used for other purposes; ask your health care provider or pharmacist if you have questions. COMMON BRAND NAME(S): Adipex-P, Atti-Plex P, Atti-Plex P Spansule, Fastin, Lomaira, Pro-Fast, Tara-8 What should I tell my health care provider before I take this medicine? They need to know if you have any of these conditions:  agitation or nervousness  diabetes  glaucoma  heart disease  high blood pressure  history of drug abuse or addiction  history of stroke  kidney disease  lung disease called Primary Pulmonary Hypertension (PPH)  taken an MAOI like Carbex, Eldepryl, Marplan, Nardil, or Parnate in last 14 days  taking stimulant medicines for attention disorders, weight loss, or to stay awake  thyroid disease  an unusual or allergic reaction to phentermine, other medicines, foods, dyes, or preservatives  pregnant or trying to get pregnant  breast-feeding How should I use this medicine? Take this medicine by mouth with a glass of water. Follow the directions on the prescription label. The instructions for use may differ based on the product and dose you are taking. Avoid taking this medicine in the evening. It may interfere with sleep. Take your doses at regular intervals. Do not take your medicine more often than directed. Talk to your pediatrician regarding the use of this medicine in children. While this drug may be prescribed for children 17 years or older for selected conditions, precautions do apply. Overdosage: If you think you have taken too much of this medicine contact a poison control center or emergency room at once. NOTE: This  medicine is only for you. Do not share this medicine with others. What if I miss a dose? If you miss a dose, take it as soon as you can. If it is almost time for your next dose, take only that dose. Do not take double or extra doses. What may interact with this medicine? Do not take this medicine with any of the following medications:  MAOIs like Carbex, Eldepryl, Marplan, Nardil, and Parnate  medicines for colds or breathing difficulties like pseudoephedrine or phenylephrine  procarbazine  sibutramine  stimulant medicines for attention disorders, weight loss, or to stay awake This medicine may also interact with the following medications:  certain medicines for depression, anxiety, or psychotic disturbances  linezolid  medicines for diabetes  medicines for high blood pressure This list may not describe all possible interactions. Give your health care provider a list of all the medicines, herbs, non-prescription drugs, or dietary supplements you use. Also tell them if you smoke, drink alcohol, or use illegal drugs. Some items may interact with your medicine. What should I watch for while using this medicine? Notify your physician immediately if you become short of breath while doing your normal activities. Do not take this medicine within 6 hours of bedtime. It can keep you from getting to sleep. Avoid drinks that contain caffeine and try to stick to a regular bedtime every night. This medicine was intended to be used in addition to a healthy diet and exercise. The best results are achieved this way. This medicine is only indicated for short-term use. Eventually your weight loss may level out. At that point, the drug  will only help you maintain your new weight. Do not increase or in any way change your dose without consulting your doctor. You may get drowsy or dizzy. Do not drive, use machinery, or do anything that needs mental alertness until you know how this medicine affects you. Do  not stand or sit up quickly, especially if you are an older patient. This reduces the risk of dizzy or fainting spells. Alcohol may increase dizziness and drowsiness. Avoid alcoholic drinks. What side effects may I notice from receiving this medicine? Side effects that you should report to your doctor or health care professional as soon as possible:  allergic reactions like skin rash, itching or hives, swelling of the face, lips, or tongue)  anxiety  breathing problems  changes in vision  chest pain or chest tightness  depressed mood or other mood changes  hallucinations, loss of contact with reality  fast, irregular heartbeat  increased blood pressure  irritable  nervousness or restlessness  painful urination  palpitations  tremors  trouble sleeping  seizures  signs and symptoms of a stroke like changes in vision; confusion; trouble speaking or understanding; severe headaches; sudden numbness or weakness of the face, arm or leg; trouble walking; dizziness; loss of balance or coordination  unusually weak or tired  vomiting Side effects that usually do not require medical attention (report to your doctor or health care professional if they continue or are bothersome):  constipation or diarrhea  dry mouth  headache  nausea  stomach upset  sweating This list may not describe all possible side effects. Call your doctor for medical advice about side effects. You may report side effects to FDA at 1-800-FDA-1088. Where should I keep my medicine? Keep out of the reach of children. This medicine can be abused. Keep your medicine in a safe place to protect it from theft. Do not share this medicine with anyone. Selling or giving away this medicine is dangerous and against the law. This medicine may cause accidental overdose and death if taken by other adults, children, or pets. Mix any unused medicine with a substance like cat litter or coffee grounds. Then throw the  medicine away in a sealed container like a sealed bag or a coffee can with a lid. Do not use the medicine after the expiration date. Store at room temperature between 20 and 25 degrees C (68 and 77 degrees F). Keep container tightly closed. NOTE: This sheet is a summary. It may not cover all possible information. If you have questions about this medicine, talk to your doctor, pharmacist, or health care provider.  2020 Elsevier/Gold Standard (2016-10-26 08:23:13)

## 2019-05-26 ENCOUNTER — Ambulatory Visit (INDEPENDENT_AMBULATORY_CARE_PROVIDER_SITE_OTHER): Payer: BC Managed Care – PPO

## 2019-05-26 ENCOUNTER — Telehealth: Payer: Self-pay

## 2019-05-26 DIAGNOSIS — R5383 Other fatigue: Secondary | ICD-10-CM

## 2019-05-26 LAB — TSH: TSH: 1.03 u[IU]/mL (ref 0.450–4.500)

## 2019-05-26 MED ORDER — CYANOCOBALAMIN 1000 MCG/ML IJ SOLN
1000.0000 ug | Freq: Once | INTRAMUSCULAR | Status: AC
  Administered 2019-05-26: 1000 ug via INTRAMUSCULAR

## 2019-05-26 NOTE — Telephone Encounter (Signed)
Pt calling; [facility] needs specific directions for phentermine before they can fill it.  Pt has appt at 3pm today and would like this taken care of before then if possible.  [phone] or cell [phone]

## 2019-05-26 NOTE — Telephone Encounter (Signed)
Done

## 2019-05-26 NOTE — Progress Notes (Signed)
Pt given B-12 inj in RD tolerated well

## 2019-06-01 ENCOUNTER — Other Ambulatory Visit: Payer: Self-pay | Admitting: Family Medicine

## 2019-06-01 DIAGNOSIS — E1165 Type 2 diabetes mellitus with hyperglycemia: Secondary | ICD-10-CM

## 2019-06-06 ENCOUNTER — Encounter: Payer: Self-pay | Admitting: Emergency Medicine

## 2019-06-06 ENCOUNTER — Other Ambulatory Visit: Payer: Self-pay

## 2019-06-06 ENCOUNTER — Ambulatory Visit
Admission: EM | Admit: 2019-06-06 | Discharge: 2019-06-06 | Disposition: A | Discharge status: Home / Self Care | Payer: 59 | Attending: Family Medicine | Admitting: Family Medicine

## 2019-06-06 DIAGNOSIS — H9201 Otalgia, right ear: Secondary | ICD-10-CM

## 2019-06-06 DIAGNOSIS — S161XXA Strain of muscle, fascia and tendon at neck level, initial encounter: Secondary | ICD-10-CM | POA: Diagnosis not present

## 2019-06-06 MED ORDER — CYCLOBENZAPRINE HCL 10 MG PO TABS: 10.0000 mg | ORAL_TABLET | Freq: Three times a day (TID) | ORAL | 0 refills | Status: AC | PRN

## 2019-06-06 MED ORDER — HYDROCODONE-ACETAMINOPHEN 5-325 MG PO TABS: ORAL_TABLET | ORAL | 0 refills | Status: DC

## 2019-06-06 NOTE — ED Triage Notes (Signed)
Pt c/o right ear pain. She states that the pain radiates down her neck back and her jaw. She states that she can not turn her head from side to side. She states that opening her mouth hurts. Started 6 days ago.

## 2019-06-06 NOTE — Discharge Instructions (Addendum)
Rest, heat to affected area, tylenol as needed

## 2019-06-07 NOTE — ED Provider Notes (Signed)
[facility]    CSN: [id number] Arrival date & time: 06/06/19  1115      History   Chief Complaint Chief Complaint  Patient presents with  . Otalgia    right  . Neck Pain    HPI Ariel Johnston is a 59 y.o. female.   59 yo female with a c/o right ear pain and right sided neck pain into the right upper back area for the past 6 days. Denies any injuries, fevers, chills, drainage, rash, numbness/tingling.    Otalgia Associated symptoms: neck pain   Neck Pain   Past Medical History:  Diagnosis Date  . Depression   . Diabetes mellitus without complication (HCC)   . GERD (gastroesophageal reflux disease)   . Hyperlipidemia   . Hypertension   . Microalbuminuria due to type 2 diabetes mellitus (HCC) 10/03/2017  . Sleep apnea     Patient Active Problem List   Diagnosis Date Noted  . Obesity, morbid, BMI 40.0-49.9 (HCC) 06/09/2018  . Microalbuminuria due to type 2 diabetes mellitus (HCC) 10/03/2017  . Elevated hemoglobin (HCC) 12/13/2016  . Chronic pain of right hip 12/11/2016  . Dysphagia 04/17/2016  . Tobacco abuse 01/17/2016  . Special screening for malignant neoplasms, colon   . Essential hypertension 01/20/2015  . Hyperlipidemia 01/20/2015  . 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 benign 11/03/2014  . Back ache 11/03/2014  . Clinical depression 11/03/2014  . Acid reflux 11/03/2014  . Obstructive apnea 11/03/2014  . Polycystic ovaries 11/03/2014  . Allergic rhinitis, seasonal 11/03/2014  . Type II diabetes mellitus, uncontrolled (HCC) 03/08/2010  . Dyshidrosis 03/08/2010  . Combined fat and carbohydrate induced hyperlipemia 08/18/2008    Past Surgical History:  Procedure Laterality Date  . COLONOSCOPY WITH PROPOFOL N/A 12/06/2015   Procedure: COLONOSCOPY WITH PROPOFOL;  Surgeon: Midge Minium, MD;  Location: [facility];  Service: Endoscopy;  Laterality: N/A;  . DILATION AND CURETTAGE OF UTERUS  2007    OB  History    Gravida  0   Para  0   Term  0   Preterm  0   AB  0   Living  0     SAB  0   TAB  0   Ectopic  0   Multiple  0   Live Births  0            Home Medications    Prior to Admission medications   Medication Sig Start Date End Date Taking? Authorizing Provider  aspirin (ASPIRIN ADULT LOW DOSE) 81 MG EC tablet Take 81 mg by mouth daily.  04/07/14  Yes [provider]  atorvastatin (LIPITOR) 40 MG tablet Take 1 tablet (40 mg total) by mouth at bedtime. 12/09/18  Yes Poulose, Percell Belt, NP  Cholecalciferol (VITAMIN D PO) Take by mouth daily.   Yes [provider]  Cyanocobalamin (B-12) 1000 MCG CAPS Take 1 capsule by mouth as needed.   Yes [provider]  FARXIGA 10 MG TABS tablet TAKE 10 MG BY MOUTH DAILY. 06/01/19  Yes Doren Custard, FNP  hydrochlorothiazide (HYDRODIURIL) 25 MG tablet Take 1 tablet (25 mg total) by mouth daily. 05/12/19  Yes Doren Custard, FNP  LEVEMIR FLEXTOUCH 100 UNIT/ML Pen Inject 30 Units into the skin daily.  11/11/18  Yes [provider]  losartan (COZAAR) 100 MG tablet Take 1 tablet (100 mg total) by mouth daily. 12/09/18  Yes Poulose, Percell Belt, NP  metFORMIN (GLUCOPHAGE) 1000  MG tablet  06/19/18  Yes [provider]  phentermine 30 MG capsule Please specify directions, refills and quantity 05/25/19  Yes Nadara Mustard, MD  TRULICITY 1.5 MG/0.5ML SOPN Inject into the skin once a week.   Yes [provider]  cyclobenzaprine (FLEXERIL) 10 MG tablet Take 1 tablet (10 mg total) by mouth 3 (three) times daily as needed for muscle spasms. 06/06/19   Payton Mccallum, MD  HYDROcodone-acetaminophen (NORCO/VICODIN) 5-325 MG tablet 1-2 tabs po bid prn 06/06/19   Payton Mccallum, MD  loratadine (CLARITIN) 10 MG tablet TAKE 1 TABLET BY MOUTH EVERY DAY Patient not taking: Reported on 05/25/2019 07/24/18   Cheryle Horsfall, NP    Family History Family History  Problem Relation Age of Onset  .  Hypertension Brother   . Kidney disease Brother   . Stroke Father   . Hypertension Maternal Grandmother   . Stroke Maternal Grandmother   . Cancer Paternal Grandmother        colon  . Diabetes Paternal Grandfather   . Heart disease Paternal Grandfather   . Hypertension Paternal Grandfather   . Breast cancer Neg Hx   . Ovarian cancer Neg Hx     Social History Social History   Tobacco Use  . Smoking status: Current Every Day Smoker    Packs/day: 0.50    Years: 35.00    Pack years: 17.50    Types: Cigarettes  . Smokeless tobacco: Never Used  Substance Use Topics  . Alcohol use: No    Alcohol/week: 0.0 standard drinks  . Drug use: No     Allergies   Actos [pioglitazone] and Victoza [liraglutide]   Review of Systems Review of Systems  HENT: Positive for ear pain.   Musculoskeletal: Positive for neck pain.     Physical Exam Triage Vital Signs ED Triage Vitals  Enc Vitals Group     BP 06/06/19 1131 140/87     Pulse Rate 06/06/19 1131 (!) 103     Resp 06/06/19 1131 18     Temp 06/06/19 1131 98 F (36.7 C)     Temp Source 06/06/19 1131 Oral     SpO2 06/06/19 1131 95 %     Weight 06/06/19 1127 248 lb (112.5 kg)     Height 06/06/19 1127 5\' 2"  (1.575 m)     Head Circumference --      Peak Flow --      Pain Score 06/06/19 1126 7     Pain Loc --      Pain Edu? --      Excl. in GC? --    No data found.  Updated Vital Signs BP 140/87 (BP Location: Right Arm)   Pulse (!) 103   Temp 98 F (36.7 C) (Oral)   Resp 18   Ht 5\' 2"  (1.575 m)   Wt 112.5 kg   SpO2 95%   BMI 45.36 kg/m   Visual Acuity Right Eye Distance:   Left Eye Distance:   Bilateral Distance:    Right Eye Near:   Left Eye Near:    Bilateral Near:     Physical Exam Vitals and nursing note reviewed.  Constitutional:      General: She is not in acute distress.    Appearance: She is not toxic-appearing or diaphoretic.  HENT:     Right Ear: Tympanic membrane normal.     Left Ear:  Tympanic membrane normal.     Mouth/Throat:     Pharynx: Oropharynx  is clear.  Musculoskeletal:     Cervical back: Normal range of motion and neck supple. Tenderness (over the right trapezius and cervical paraspinous muscles) present. No rigidity.  Neurological:     Mental Status: She is alert.      UC Treatments / Results  Labs (all labs ordered are listed, but only abnormal results are displayed) Labs Reviewed - No data to display  EKG   Radiology No results found.  Procedures Procedures (including critical care time)  Medications Ordered in UC Medications - No data to display  Initial Impression / Assessment and Plan / UC Course  I have reviewed the triage vital signs and the nursing notes.  Pertinent labs & imaging results that were available during my care of the patient were reviewed by me and considered in my medical decision making (see chart for details).      Final Clinical Impressions(s) / UC Diagnoses   Final diagnoses:  Strain of neck muscle, initial encounter     Discharge Instructions     Rest, heat to affected area, tylenol as needed    ED Prescriptions    Medication Sig Dispense Auth. Provider   cyclobenzaprine (FLEXERIL) 10 MG tablet Take 1 tablet (10 mg total) by mouth 3 (three) times daily as needed for muscle spasms. 30 tablet Norval Gable, MD   HYDROcodone-acetaminophen (NORCO/VICODIN) 5-325 MG tablet 1-2 tabs po bid prn 6 tablet Norval Gable, MD      1. diagnosis reviewed with patient 2. rx as per orders above; reviewed possible side effects, interactions, risks and benefits  3. Recommend supportive treatment as above 4. Follow-up prn if symptoms worsen or don't improve   I have reviewed the PDMP during this encounter.   Norval Gable, MD 06/07/19 [id number]

## 2019-06-12 ENCOUNTER — Ambulatory Visit: Payer: BC Managed Care – PPO | Admitting: Family Medicine

## 2019-06-16 ENCOUNTER — Other Ambulatory Visit: Payer: Self-pay | Admitting: Obstetrics & Gynecology

## 2019-06-22 ENCOUNTER — Encounter: Payer: Self-pay | Admitting: Obstetrics & Gynecology

## 2019-06-22 ENCOUNTER — Other Ambulatory Visit: Payer: Self-pay

## 2019-06-22 ENCOUNTER — Ambulatory Visit (INDEPENDENT_AMBULATORY_CARE_PROVIDER_SITE_OTHER): Payer: 59 | Admitting: Obstetrics & Gynecology

## 2019-06-22 VITALS — BP 128/80 | Ht 62.0 in | Wt 247.0 lb

## 2019-06-22 DIAGNOSIS — R5383 Other fatigue: Secondary | ICD-10-CM

## 2019-06-22 DIAGNOSIS — Z6841 Body Mass Index (BMI) 40.0 and over, adult: Secondary | ICD-10-CM

## 2019-06-22 MED ORDER — PHENTERMINE HCL 37.5 MG PO TABS: ORAL_TABLET | ORAL | 1 refills | Status: DC

## 2019-06-22 MED ORDER — CYANOCOBALAMIN 1000 MCG/ML IJ SOLN
1000.0000 ug | Freq: Once | INTRAMUSCULAR | Status: AC
  Administered 2019-06-22: 1000 ug via INTRAMUSCULAR

## 2019-06-22 NOTE — Progress Notes (Signed)
  History of Present Illness:  Ariel Johnston is a 59 y.o. who was started on Phentermine 30 mg capsules, also B12 injections,  approximately 1 month ago due to obesity/abnormal weight gain. The patient has lost 4 pounds over the past month due to exercise and meds.   She has these side effects: dry mouth.  PMHx: She  has a past medical history of Depression, Diabetes mellitus without complication (HCC), GERD (gastroesophageal reflux disease), Hyperlipidemia, Hypertension, Microalbuminuria due to type 2 diabetes mellitus (HCC) (10/03/2017), and Sleep apnea. Also,  has a past surgical history that includes Dilation and curettage of uterus (2007) and Colonoscopy with propofol (N/A, 12/06/2015)., family history includes Cancer in her paternal grandmother; Diabetes in her paternal grandfather; Heart disease in her paternal grandfather; Hypertension in her brother, maternal grandmother, and paternal grandfather; Kidney disease in her brother; Stroke in her father and maternal grandmother.,  reports that she has been smoking cigarettes. She has a 17.50 pack-year smoking history. She has never used smokeless tobacco. She reports that she does not drink alcohol or use drugs.  She has a current medication list which includes the following prescription(s): aspirin, atorvastatin, vitamin d, cyanocobalamin, b-12, cyclobenzaprine, farxiga, hydrochlorothiazide, hydrocodone-acetaminophen, levemir flextouch, loratadine, losartan, metformin, trulicity, and phentermine. Also, is allergic to actos [pioglitazone] and victoza [liraglutide].  Review of Systems  All other systems reviewed and are negative.   Physical Exam:  BP 128/80   Ht 5\' 2"  (1.575 m)   Wt 247 lb (112 kg)   BMI 45.18 kg/m  Body mass index is 45.18 kg/m. Filed Weights   06/22/19 0826  Weight: 247 lb (112 kg)    Physical Exam Constitutional:      General: She is not in acute distress.    Appearance: She is well-developed.  Musculoskeletal:         General: Normal range of motion.  Neurological:     Mental Status: She is alert and oriented to person, place, and time.  Skin:    General: Skin is warm and dry.  Vitals reviewed.     Assessment: morbid obesity Medication treatment is going well for her.  Plan: Patient is continued/added to prescription appetite suppressants: Phentermine 37.5 mg capsules, continue B12 as well w monthly injections.   Will continue to assist patient in incorporating positive experiences into her life to promote a positive mental attitude.  Education given regarding appropriate lifestyle changes for weight loss, including regular physical activity, healthy coping strategies, caloric restriction, and healthy eating patterns.  The risks and benefits as well as side effects of medication, such as Phenteramine or Tenuate, is discussed.  The pros and cons of suppressing appetite and boosting metabolism is counseled.  Risks of tolerance and addiction discussed.  Use of medicine will be short term.  Pt to call with any negative side effects and agrees to keep follow up appointments.  A total of 20 minutes were spent face-to-face with the patient as well as preparation, review, communication, and documentation during this encounter.   06/24/19, MD, Annam[facility] Major [facility]/G[facility], [facility] 06/22/2019  8:43 AM

## 2019-06-22 NOTE — Addendum Note (Signed)
Addended by: Cornelius Moras D on: 06/22/2019 08:50 AM   Modules accepted: Orders

## 2019-06-23 ENCOUNTER — Ambulatory Visit: Payer: BC Managed Care – PPO

## 2019-07-13 ENCOUNTER — Ambulatory Visit
Admission: RE | Admit: 2019-07-13 | Discharge: 2019-07-13 | Disposition: A | Discharge status: Home / Self Care | Payer: 59 | Source: Ambulatory Visit | Attending: Obstetrics & Gynecology | Admitting: Obstetrics & Gynecology

## 2019-07-13 DIAGNOSIS — Z1231 Encounter for screening mammogram for malignant neoplasm of breast: Secondary | ICD-10-CM

## 2019-07-21 ENCOUNTER — Ambulatory Visit: Payer: 59

## 2019-07-22 ENCOUNTER — Ambulatory Visit (INDEPENDENT_AMBULATORY_CARE_PROVIDER_SITE_OTHER): Payer: 59

## 2019-07-22 ENCOUNTER — Telehealth: Payer: Self-pay

## 2019-07-22 ENCOUNTER — Other Ambulatory Visit: Payer: Self-pay

## 2019-07-22 DIAGNOSIS — D519 Vitamin B12 deficiency anemia, unspecified: Secondary | ICD-10-CM

## 2019-07-22 DIAGNOSIS — R5383 Other fatigue: Secondary | ICD-10-CM | POA: Diagnosis not present

## 2019-07-22 MED ORDER — CYANOCOBALAMIN 1000 MCG/ML IJ SOLN
1000.0000 ug | Freq: Once | INTRAMUSCULAR | Status: AC
  Administered 2019-07-22: 1000 ug via INTRAMUSCULAR

## 2019-07-22 NOTE — Progress Notes (Signed)
Pt here for B12 inj which was given IM left deltoid.  NDC# 00370-488-89(VQ bottle/69680-112-10 on pick list).  Pt states the injs doesn't seem to be helping her.  States she is exhausted.  This is her third inj; states the only time she felt better was the first two weeks after the first inj.  Adv PH not in office today but will let him know.

## 2019-07-23 NOTE — Telephone Encounter (Signed)
Let her know she probably can still benefit from B12, if she would prefer pills over injections that is fine.  Not sure why she still has fatigue.

## 2019-07-23 NOTE — Telephone Encounter (Signed)
Pt aware. Says she was on pills before the inj.  Also states she started feeling better a few hours after yesterday's inj.  This morning she feels 75% better.  She actually felt like getting up this morning and cleaned the house a little bit.  Told pt I hoped she continued to improve.  Pt appreciative.

## 2019-08-25 ENCOUNTER — Ambulatory Visit: Payer: 59 | Admitting: Obstetrics & Gynecology

## 2019-08-26 ENCOUNTER — Ambulatory Visit: Payer: 59 | Admitting: Obstetrics & Gynecology

## 2019-08-26 ENCOUNTER — Encounter: Payer: Self-pay | Admitting: Obstetrics & Gynecology

## 2019-08-26 ENCOUNTER — Other Ambulatory Visit: Payer: Self-pay

## 2019-08-26 DIAGNOSIS — E538 Deficiency of other specified B group vitamins: Secondary | ICD-10-CM | POA: Diagnosis not present

## 2019-08-26 DIAGNOSIS — Z6841 Body Mass Index (BMI) 40.0 and over, adult: Secondary | ICD-10-CM | POA: Diagnosis not present

## 2019-08-26 MED ORDER — TOPIRAMATE 25 MG PO TABS: 25.0000 mg | ORAL_TABLET | Freq: Two times a day (BID) | ORAL | 5 refills | Status: AC

## 2019-08-26 MED ORDER — DIETHYLPROPION HCL 25 MG PO TABS: 25.0000 mg | ORAL_TABLET | Freq: Every morning | ORAL | 1 refills | Status: AC

## 2019-08-26 MED ORDER — CYANOCOBALAMIN 1000 MCG/ML IJ SOLN
1000.0000 ug | Freq: Once | INTRAMUSCULAR | Status: AC
  Administered 2019-08-26: 1000 ug via INTRAMUSCULAR

## 2019-08-26 NOTE — Patient Instructions (Signed)
Topiramate tablets What is this medicine? TOPIRAMATE (toe PYRE a mate) is used to treat seizures in adults or children with epilepsy. It is also used for the prevention of migraine headaches, and for weight loss. This medicine may be used for other purposes; ask your health care provider or pharmacist if you have questions. COMMON BRAND NAME(S): Topamax, Topiragen What should I tell my health care provider before I take this medicine? They need to know if you have any of these conditions:  bleeding disorders  kidney disease  lung or breathing disease, like asthma  suicidal thoughts, plans, or attempt; a previous suicide attempt by you or a family member  an unusual or allergic reaction to topiramate, other medicines, foods, dyes, or preservatives  pregnant or trying to get pregnant  breast-feeding How should I use this medicine? Take this medicine by mouth with a glass of water. Follow the directions on the prescription label. Do not cut, crush or chew this medicine. Swallow the tablets whole. You can take it with or without food. If it upsets your stomach, take it with food. Take your medicine at regular intervals. Do not take it more often than directed. Do not stop taking except on your doctor's advice. A special MedGuide will be given to you by the pharmacist with each prescription and refill. Be sure to read this information carefully each time. Talk to your pediatrician regarding the use of this medicine in children. While this drug may be prescribed for children as young as 65 years of age for selected conditions, precautions do apply. Overdosage: If you think you have taken too much of this medicine contact a poison control center or emergency room at once. NOTE: This medicine is only for you. Do not share this medicine with others. What if I miss a dose? If you miss a dose, take it as soon as you can. If your next dose is to be taken in less than 6 hours, then do not take the  missed dose. Take the next dose at your regular time. Do not take double or extra doses. What may interact with this medicine? This medicine may interact with the following medications:  acetazolamide  alcohol  antihistamines for allergy, cough, and cold  aspirin and aspirin-like medicines  atropine  birth control pills  certain medicines for anxiety or sleep  certain medicines for bladder problems like oxybutynin, tolterodine  certain medicines for depression like amitriptyline, fluoxetine, sertraline  certain medicines for seizures like carbamazepine, phenobarbital, phenytoin, primidone, valproic acid, zonisamide  certain medicines for stomach problems like dicyclomine, hyoscyamine  certain medicines for travel sickness like scopolamine  certain medicines for Parkinson's disease like benztropine, trihexyphenidyl  certain medicines that treat or prevent blood clots like warfarin, enoxaparin, dalteparin, apixaban, dabigatran, and rivaroxaban  digoxin  general anesthetics like halothane, isoflurane, methoxyflurane, propofol  hydrochlorothiazide  ipratropium  lithium  medicines that relax muscles for surgery  metformin  narcotic medicines for pain  NSAIDs, medicines for pain and inflammation, like ibuprofen or naproxen  phenothiazines like chlorpromazine, mesoridazine, prochlorperazine, thioridazine  pioglitazone This list may not describe all possible interactions. Give your health care provider a list of all the medicines, herbs, non-prescription drugs, or dietary supplements you use. Also tell them if you smoke, drink alcohol, or use illegal drugs. Some items may interact with your medicine. What should I watch for while using this medicine? Visit your doctor or health care professional for regular checks on your progress. Tell your health care professional if  your symptoms do not start to get better or if they get worse. Do not stop taking except on your  health care professional's advice. You may develop a severe reaction. Your health care professional will tell you how much medicine to take. Wear a medical ID bracelet or chain. Carry a card that describes your disease and details of your medicine and dosage times. This medicine can reduce the response of your body to heat or cold. Dress warm in cold weather and stay hydrated in hot weather. If possible, avoid extreme temperatures like saunas, hot tubs, very hot or cold showers, or activities that can cause dehydration such as vigorous exercise. Check with your health care professional if you have severe diarrhea, nausea, and vomiting, or if you sweat a lot. The loss of too much body fluid may make it dangerous for you to take this medicine. You may get drowsy or dizzy. Do not drive, use machinery, or do anything that needs mental alertness until you know how this medicine affects you. Do not stand up or sit up quickly, especially if you are an older patient. This reduces the risk of dizzy or fainting spells. Alcohol may interfere with the effect of this medicine. Avoid alcoholic drinks. Tell your health care professional right away if you have any change in your eyesight. Patients and their families should watch out for new or worsening depression or thoughts of suicide. Also watch out for sudden changes in feelings such as feeling anxious, agitated, panicky, irritable, hostile, aggressive, impulsive, severely restless, overly excited and hyperactive, or not being able to sleep. If this happens, especially at the beginning of treatment or after a change in dose, call your healthcare professional. This medicine may cause serious skin reactions. They can happen weeks to months after starting the medicine. Contact your health care provider right away if you notice fevers or flu-like symptoms with a rash. The rash may be red or purple and then turn into blisters or peeling of the skin. Or, you might notice a red  rash with swelling of the face, lips or lymph nodes in your neck or under your arms. Birth control may not work properly while you are taking this medicine. Talk to your health care professional about using an extra method of birth control. Women should inform their health care professional if they wish to become pregnant or think they might be pregnant. There is a potential for serious side effects and harm to an unborn child. Talk to your health care professional for more information. What side effects may I notice from receiving this medicine? Side effects that you should report to your doctor or health care professional as soon as possible:  allergic reactions like skin rash, itching or hives, swelling of the face, lips, or tongue  blood in the urine  changes in vision  confusion  loss of memory  pain in lower back or side  pain when urinating  redness, blistering, peeling or loosening of the skin, including inside the mouth  signs and symptoms of bleeding such as bloody or black, tarry stools; red or dark brown urine; spitting up blood or brown material that looks like coffee grounds; red spots on the skin; unusual bruising or bleeding from the eyes, gums, or nose  signs and symptoms of increased acid in the body like breathing fast; fast heartbeat; headache; confusion; unusually weak or tired; nausea, vomiting  suicidal thoughts, mood changes  trouble speaking or understanding  unusual sweating  unusually weak  or tired Side effects that usually do not require medical attention (report to your doctor or health care professional if they continue or are bothersome):  dizziness  drowsiness  fever  loss of appetite  nausea, vomiting  pain, tingling, numbness in the hands or feet  stomach pain  tiredness  upset stomach This list may not describe all possible side effects. Call your doctor for medical advice about side effects. You may report side effects to FDA at  1-800-FDA-1088. Where should I keep my medicine? Keep out of the reach of children. Store at room temperature between 15 and 30 degrees C (59 and 86 degrees F). Throw away any unused medicine after the expiration date. NOTE: This sheet is a summary. It may not cover all possible information. If you have questions about this medicine, talk to your doctor, pharmacist, or health care provider.  2020 Elsevier/Gold Standard (2018-12-11 15:07:20)

## 2019-08-26 NOTE — Progress Notes (Signed)
  History of Present Illness:  Ariel Johnston is a 59 y.o. who was started on Phentermine approximately 3 months ago due to obesity/abnormal weight gain. The patient has lost 3 pounds over the past 2 mos due to diet, exercise, and meds..   She has these side effects: none.  PMHx: She  has a past medical history of Depression, Diabetes mellitus without complication (HCC), GERD (gastroesophageal reflux disease), Hyperlipidemia, Hypertension, Microalbuminuria due to type 2 diabetes mellitus (HCC) (10/03/2017), and Sleep apnea. Also,  has a past surgical history that includes Dilation and curettage of uterus (2007) and Colonoscopy with propofol (N/A, 12/06/2015)., family history includes Cancer in her paternal grandmother; Diabetes in her paternal grandfather; Heart disease in her paternal grandfather; Hypertension in her brother, maternal grandmother, and paternal grandfather; Kidney disease in her brother; Stroke in her father and maternal grandmother.,  reports that she has been smoking cigarettes. She has a 17.50 pack-year smoking history. She has never used smokeless tobacco. She reports that she does not drink alcohol or use drugs.  She has a current medication list which includes the following prescription(s): aspirin, atorvastatin, vitamin d, cyanocobalamin, b-12, cyclobenzaprine, farxiga, hydrochlorothiazide, levemir flextouch, loratadine, losartan, metformin, trulicity, diethylpropion hcl, and topiramate. Also, is allergic to actos [pioglitazone] and victoza [liraglutide].  Review of Systems  All other systems reviewed and are negative.   Physical Exam:  BP 120/80   Ht 5\' 3"  (1.6 m)   Wt 244 lb (110.7 kg)   BMI 43.22 kg/m  Body mass index is 43.22 kg/m. Filed Weights   08/26/19 0820  Weight: 244 lb (110.7 kg)    Physical Exam Constitutional:      General: She is not in acute distress.    Appearance: She is well-developed.  Musculoskeletal:        General: Normal range of motion.    Neurological:     Mental Status: She is alert and oriented to person, place, and time.  Skin:    General: Skin is warm and dry.  Vitals reviewed.    Assessment: morbid obesity Medication treatment is going marginally for her.  Plan: Patient is continued/added to prescription appetite suppressants: Change to Tenuate and add in Topamax, self-directed dieting and exercise; also to cont w B12 injections monthly.   Will continue to assist patient in incorporating positive experiences into her life to promote a positive mental attitude.  Education given regarding appropriate lifestyle changes for weight loss, including regular physical activity, healthy coping strategies, caloric restriction, and healthy eating patterns.  The risks and benefits as well as side effects of medication, such as Tenuate, is discussed.  The pros and cons of suppressing appetite and boosting metabolism is counseled.  Risks of tolerance and addiction discussed.  Use of medicine will be short term.  Pt to call with any negative side effects and agrees to keep follow up appointments.  A total of 20 minutes were spent face-to-face with the patient as well as preparation, review, communication, and documentation during this encounter.   08/28/19, MD, Annam[facility], [facility] 08/26/2019  8:41 AM

## 2019-09-24 ENCOUNTER — Ambulatory Visit (INDEPENDENT_AMBULATORY_CARE_PROVIDER_SITE_OTHER): Payer: 59

## 2019-09-24 ENCOUNTER — Other Ambulatory Visit: Payer: Self-pay

## 2019-09-24 DIAGNOSIS — E538 Deficiency of other specified B group vitamins: Secondary | ICD-10-CM

## 2019-09-24 MED ORDER — CYANOCOBALAMIN 1000 MCG/ML IJ SOLN
1000.0000 ug | Freq: Once | INTRAMUSCULAR | Status: AC
  Administered 2019-09-24: 1000 ug via INTRAMUSCULAR

## 2019-10-22 ENCOUNTER — Ambulatory Visit: Payer: 59 | Admitting: Obstetrics & Gynecology

## 2019-10-23 ENCOUNTER — Other Ambulatory Visit: Payer: Self-pay

## 2019-10-23 ENCOUNTER — Ambulatory Visit (INDEPENDENT_AMBULATORY_CARE_PROVIDER_SITE_OTHER): Payer: No Typology Code available for payment source

## 2019-10-23 DIAGNOSIS — E538 Deficiency of other specified B group vitamins: Secondary | ICD-10-CM | POA: Diagnosis not present

## 2019-10-23 MED ORDER — CYANOCOBALAMIN 1000 MCG/ML IJ SOLN
1000.0000 ug | Freq: Once | INTRAMUSCULAR | Status: AC
  Administered 2019-10-23: 1000 ug via INTRAMUSCULAR

## 2019-11-23 ENCOUNTER — Ambulatory Visit: Payer: 59 | Admitting: Obstetrics & Gynecology

## 2019-11-23 ENCOUNTER — Ambulatory Visit (INDEPENDENT_AMBULATORY_CARE_PROVIDER_SITE_OTHER): Payer: No Typology Code available for payment source

## 2019-11-23 ENCOUNTER — Other Ambulatory Visit: Payer: Self-pay

## 2019-11-23 DIAGNOSIS — E538 Deficiency of other specified B group vitamins: Secondary | ICD-10-CM | POA: Diagnosis not present

## 2019-11-23 MED ORDER — CYANOCOBALAMIN 1000 MCG/ML IJ SOLN
1000.0000 ug | Freq: Once | INTRAMUSCULAR | Status: AC
  Administered 2019-11-23: 1000 ug via INTRAMUSCULAR

## 2019-11-23 NOTE — Progress Notes (Signed)
Pt was here for B12 injection. Given IM Right Deltoid. Pt tolerated it well.

## 2020-07-15 ENCOUNTER — Other Ambulatory Visit: Payer: Self-pay | Admitting: Family Medicine

## 2020-07-15 DIAGNOSIS — R1011 Right upper quadrant pain: Secondary | ICD-10-CM

## 2020-07-18 ENCOUNTER — Ambulatory Visit
Admission: RE | Admit: 2020-07-18 | Discharge: 2020-07-18 | Disposition: A | Discharge status: Home / Self Care | Payer: No Typology Code available for payment source | Source: Ambulatory Visit | Attending: Family Medicine | Admitting: Family Medicine

## 2020-07-18 ENCOUNTER — Other Ambulatory Visit: Payer: Self-pay

## 2020-07-18 ENCOUNTER — Other Ambulatory Visit: Payer: Self-pay | Admitting: Family Medicine

## 2020-07-18 DIAGNOSIS — R1011 Right upper quadrant pain: Secondary | ICD-10-CM

## 2020-07-25 ENCOUNTER — Other Ambulatory Visit: Payer: Self-pay | Admitting: Family Medicine

## 2020-07-25 DIAGNOSIS — Z1231 Encounter for screening mammogram for malignant neoplasm of breast: Secondary | ICD-10-CM

## 2020-08-04 ENCOUNTER — Other Ambulatory Visit: Payer: Self-pay

## 2020-08-04 ENCOUNTER — Ambulatory Visit
Admission: RE | Admit: 2020-08-04 | Discharge: 2020-08-04 | Disposition: A | Discharge status: Home / Self Care | Payer: No Typology Code available for payment source | Source: Ambulatory Visit | Attending: Family Medicine | Admitting: Family Medicine

## 2020-08-04 DIAGNOSIS — Z1231 Encounter for screening mammogram for malignant neoplasm of breast: Secondary | ICD-10-CM | POA: Diagnosis not present

## 2020-10-10 ENCOUNTER — Other Ambulatory Visit: Payer: Self-pay | Admitting: Gastroenterology

## 2020-10-10 DIAGNOSIS — R748 Abnormal levels of other serum enzymes: Secondary | ICD-10-CM

## 2020-10-10 DIAGNOSIS — R1013 Epigastric pain: Secondary | ICD-10-CM

## 2020-10-21 ENCOUNTER — Other Ambulatory Visit: Payer: Self-pay | Admitting: Gastroenterology

## 2020-10-21 ENCOUNTER — Ambulatory Visit
Admission: RE | Admit: 2020-10-21 | Discharge: 2020-10-21 | Disposition: A | Discharge status: Home / Self Care | Payer: No Typology Code available for payment source | Source: Ambulatory Visit | Attending: Gastroenterology | Admitting: Gastroenterology

## 2020-10-21 ENCOUNTER — Other Ambulatory Visit: Payer: Self-pay

## 2020-10-21 DIAGNOSIS — R748 Abnormal levels of other serum enzymes: Secondary | ICD-10-CM | POA: Diagnosis present

## 2020-10-21 DIAGNOSIS — R1013 Epigastric pain: Secondary | ICD-10-CM | POA: Diagnosis present

## 2020-10-21 MED ORDER — GADOBUTROL 1 MMOL/ML IV SOLN
10.0000 mL | Freq: Once | INTRAVENOUS | Status: AC | PRN
  Administered 2020-10-21: 10 mL via INTRAVENOUS

## 2021-01-09 ENCOUNTER — Ambulatory Visit: Admit: 2021-01-09 | Payer: No Typology Code available for payment source

## 2021-01-09 SURGERY — EGD (ESOPHAGOGASTRODUODENOSCOPY)
Anesthesia: General

## 2021-07-02 ENCOUNTER — Ambulatory Visit
Admission: RE | Admit: 2021-07-02 | Discharge: 2021-07-02 | Disposition: A | Discharge status: Home / Self Care | Payer: No Typology Code available for payment source | Source: Ambulatory Visit | Attending: Emergency Medicine | Admitting: Emergency Medicine

## 2021-07-02 ENCOUNTER — Other Ambulatory Visit: Payer: Self-pay

## 2021-07-02 VITALS — BP 159/90 | HR 95 | Temp 98.3°F | Resp 15 | Ht 63.0 in | Wt 244.0 lb

## 2021-07-02 DIAGNOSIS — J069 Acute upper respiratory infection, unspecified: Secondary | ICD-10-CM | POA: Diagnosis not present

## 2021-07-02 DIAGNOSIS — Z20822 Contact with and (suspected) exposure to covid-19: Secondary | ICD-10-CM | POA: Diagnosis not present

## 2021-07-02 DIAGNOSIS — E7801 Familial hypercholesterolemia: Secondary | ICD-10-CM | POA: Diagnosis not present

## 2021-07-02 DIAGNOSIS — G4730 Sleep apnea, unspecified: Secondary | ICD-10-CM | POA: Insufficient documentation

## 2021-07-02 DIAGNOSIS — R059 Cough, unspecified: Secondary | ICD-10-CM | POA: Insufficient documentation

## 2021-07-02 DIAGNOSIS — K219 Gastro-esophageal reflux disease without esophagitis: Secondary | ICD-10-CM | POA: Insufficient documentation

## 2021-07-02 DIAGNOSIS — J029 Acute pharyngitis, unspecified: Secondary | ICD-10-CM | POA: Insufficient documentation

## 2021-07-02 DIAGNOSIS — E119 Type 2 diabetes mellitus without complications: Secondary | ICD-10-CM | POA: Insufficient documentation

## 2021-07-02 DIAGNOSIS — I10 Essential (primary) hypertension: Secondary | ICD-10-CM | POA: Diagnosis not present

## 2021-07-02 LAB — GROUP A STREP BY PCR: Group A Strep by PCR: NOT DETECTED

## 2021-07-02 MED ORDER — ALBUTEROL SULFATE HFA 108 (90 BASE) MCG/ACT IN AERS: 2.0000 | INHALATION_SPRAY | RESPIRATORY_TRACT | 0 refills | Status: AC | PRN

## 2021-07-02 MED ORDER — PROMETHAZINE-DM 6.25-15 MG/5ML PO SYRP: 5.0000 mL | ORAL_SOLUTION | Freq: Four times a day (QID) | ORAL | 0 refills | Status: AC | PRN

## 2021-07-02 MED ORDER — BENZONATATE 100 MG PO CAPS: 100.0000 mg | ORAL_CAPSULE | Freq: Three times a day (TID) | ORAL | 0 refills | Status: AC

## 2021-07-02 MED ORDER — METHYLPREDNISOLONE SODIUM SUCC 40 MG IJ SOLR
60.0000 mg | Freq: Once | INTRAMUSCULAR | Status: AC
  Administered 2021-07-02: 60 mg via INTRAMUSCULAR

## 2021-07-02 NOTE — ED Provider Notes (Signed)
[facility]    CSN: [id number] Arrival date & time: 07/02/21  1256      History   Chief Complaint Chief Complaint  Patient presents with   Appointment    1pm   Cough    HPI Ariel Johnston is a 61 y.o. female.   Patient presents with fever, sore throat and a nonproductive cough for 4 days.  Endorses rhinorrhea beginning today.  Fever has resolved.  Endorses that coughing has worsened.  Tolerating food and liquids.  No known sick contacts.  History of diabetes mellitus, GERD, hyperlipidemia, hypertension, sleep apnea.   Past Medical History:  Diagnosis Date   Depression    Diabetes mellitus without complication ([place])    GERD (gastroesophageal reflux disease)    Hyperlipidemia    Hypertension    Microalbuminuria due to type 2 diabetes mellitus ([place]) 10/03/2017   Sleep apnea     Patient Active Problem List   Diagnosis Date Noted   Obesity, morbid, BMI 40.0-49.9 ([place]) 06/09/2018   Microalbuminuria due to type 2 diabetes mellitus ([place]) 10/03/2017   Elevated hemoglobin (HCC) 12/13/2016   Chronic pain of right hip 12/11/2016   Dysphagia 04/17/2016   Tobacco abuse 01/17/2016   Special screening for malignant neoplasms, colon    Essential hypertension 01/20/2015   Hyperlipidemia 01/20/2015   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 benign 11/03/2014   Back ache 11/03/2014   Clinical depression 11/03/2014   Acid reflux 11/03/2014   Obstructive apnea 11/03/2014   Polycystic ovaries 11/03/2014   Allergic rhinitis, seasonal 11/03/2014   Type II diabetes mellitus, uncontrolled 03/08/2010   Dyshidrosis 03/08/2010   Combined fat and carbohydrate induced hyperlipemia 08/18/2008    Past Surgical History:  Procedure Laterality Date   COLONOSCOPY WITH PROPOFOL N/A 12/06/2015   Procedure: COLONOSCOPY WITH PROPOFOL;  Surgeon: Lucilla Lame, MD;  Location: [facility] ENDOSCOPY;  Service: Endoscopy;  Laterality: N/A;   DILATION AND CURETTAGE  OF UTERUS  2007    OB History     Gravida  0   Para  0   Term  0   Preterm  0   AB  0   Living  0      SAB  0   IAB  0   Ectopic  0   Multiple  0   Live Births  0            Home Medications    Prior to Admission medications   Medication Sig Start Date End Date Taking? Authorizing Provider  aspirin (ASPIRIN ADULT LOW DOSE) 81 MG EC tablet Take 81 mg by mouth daily.  04/07/14   [provider]  atorvastatin (LIPITOR) 40 MG tablet Take 1 tablet (40 mg total) by mouth at bedtime. 12/09/18   Poulose, Bethel Born, NP  Cholecalciferol (VITAMIN D PO) Take by mouth daily.    [provider]  cyanocobalamin (,VITAMIN B-12,) 1000 MCG/ML injection Inject 1 mL (1,000 mcg total) into the muscle every 30 (thirty) days. 06/16/19   Gae Dry, MD  Cyanocobalamin (B-12) 1000 MCG CAPS Take 1 capsule by mouth as needed.    [provider]  cyclobenzaprine (FLEXERIL) 10 MG tablet Take 1 tablet (10 mg total) by mouth 3 (three) times daily as needed for muscle spasms. 06/06/19   Norval Gable, MD  Diethylpropion HCl 25 MG TABS Take 1 tablet (25 mg total) by mouth in the morning. 08/26/19   Gae Dry, MD  FARXIGA 10 MG  TABS tablet TAKE 10 MG BY MOUTH DAILY. 06/01/19   Hubbard Hartshorn, FNP  hydrochlorothiazide (HYDRODIURIL) 25 MG tablet Take 1 tablet (25 mg total) by mouth daily. 05/12/19   Hubbard Hartshorn, FNP  LEVEMIR FLEXTOUCH 100 UNIT/ML Pen Inject 30 Units into the skin daily.  11/11/18   [provider]  loratadine (CLARITIN) 10 MG tablet TAKE 1 TABLET BY MOUTH EVERY DAY 07/24/18   Poulose, Bethel Born, NP  losartan (COZAAR) 100 MG tablet Take 1 tablet (100 mg total) by mouth daily. 12/09/18   Poulose, Bethel Born, NP  metFORMIN (GLUCOPHAGE) 1000 MG tablet  06/19/18   [provider]  topiramate (TOPAMAX) 25 MG tablet Take 1 tablet (25 mg total) by mouth 2 (two) times daily. 08/26/19   Gae Dry, MD  TRULICITY 1.5 0000000 SOPN  Inject into the skin once a week.    [provider]    Family History Family History  Problem Relation Age of Onset   Hypertension Brother    Kidney disease Brother    Stroke Father    Hypertension Maternal Grandmother    Stroke Maternal Grandmother    Cancer Paternal Grandmother        colon   Diabetes Paternal Grandfather    Heart disease Paternal Grandfather    Hypertension Paternal Grandfather    Breast cancer Neg Hx    Ovarian cancer Neg Hx     Social History Social History   Tobacco Use   Smoking status: Every Day    Packs/day: 0.50    Years: 35.00    Pack years: 17.50    Types: Cigarettes   Smokeless tobacco: Never  Vaping Use   Vaping Use: Never used  Substance Use Topics   Alcohol use: No    Alcohol/week: 0.0 standard drinks   Drug use: No     Allergies   Actos [pioglitazone] and Victoza [liraglutide]   Review of Systems Review of Systems  Constitutional:  Positive for fever. Negative for activity change, appetite change, chills, diaphoresis, fatigue and unexpected weight change.  HENT:  Positive for sore throat. Negative for congestion, dental problem, drooling, ear discharge, ear pain, facial swelling, hearing loss, mouth sores, nosebleeds, postnasal drip, rhinorrhea, sinus pressure, sinus pain, sneezing, tinnitus, trouble swallowing and voice change.   Respiratory:  Positive for cough. Negative for apnea, choking, chest tightness, shortness of breath, wheezing and stridor.   Cardiovascular: Negative.   Gastrointestinal: Negative.   Skin: Negative.   Neurological: Negative.     Physical Exam Triage Vital Signs ED Triage Vitals  Enc Vitals Group     BP 07/02/21 1309 (!) 159/90     Pulse Rate 07/02/21 1309 95     Resp 07/02/21 1309 15     Temp 07/02/21 1309 98.3 F (36.8 C)     Temp Source 07/02/21 1309 Oral     SpO2 07/02/21 1309 97 %     Weight 07/02/21 1307 244 lb 0.8 oz (110.7 kg)     Height 07/02/21 1307 5\' 3"  (1.6 m)     Head  Circumference --      Peak Flow --      Pain Score 07/02/21 1306 0     Pain Loc --      Pain Edu? --      Excl. in [place]? --    No data found.  Updated Vital Signs BP (!) 159/90 (BP Location: Left Arm)    Pulse 95    Temp 98.3  F (36.8 C) (Oral)    Resp 15    Ht 5\' 3"  (1.6 m)    Wt 244 lb 0.8 oz (110.7 kg)    SpO2 97%    BMI 43.23 kg/m   Visual Acuity Right Eye Distance:   Left Eye Distance:   Bilateral Distance:    Right Eye Near:   Left Eye Near:    Bilateral Near:     Physical Exam Constitutional:      Appearance: Normal appearance.  HENT:     Head: Normocephalic.     Right Ear: Tympanic membrane and external ear normal.     Left Ear: Tympanic membrane, ear canal and external ear normal.     Nose: Rhinorrhea present.     Mouth/Throat:     Mouth: Mucous membranes are moist.     Pharynx: Oropharynx is clear.  Eyes:     Extraocular Movements: Extraocular movements intact.  Cardiovascular:     Rate and Rhythm: Normal rate and regular rhythm.     Pulses: Normal pulses.     Heart sounds: Normal heart sounds.  Pulmonary:     Effort: Pulmonary effort is normal.     Breath sounds: Wheezing present.     Comments: Dry cough witnessed on exam Musculoskeletal:     Cervical back: Normal range of motion and neck supple.  Skin:    General: Skin is warm and dry.  Neurological:     Mental Status: She is alert and oriented to person, place, and time. Mental status is at baseline.  Psychiatric:        Mood and Affect: Mood normal.        Behavior: Behavior normal.     UC Treatments / Results  Labs (all labs ordered are listed, but only abnormal results are displayed) Labs Reviewed  GROUP A STREP BY PCR    EKG   Radiology No results found.  Procedures Procedures (including critical care time)  Medications Ordered in UC Medications - No data to display  Initial Impression / Assessment and Plan / UC Course  I have reviewed the triage vital signs and the nursin  g  notes.  Pertinent labs & imaging results that were available during my care of the patient were reviewed by me and considered in my medical decision making (see chart for details).  viral URI with cough  Vital signs are stable, O2 saturation 97% on room air, wheezing heard on exam, low suspicion of pneumonia, will defer imaging at this time, discussed with patient, strep PCR negative, COVID test pending, due to medical history, will qualify for antiviral treatment, discussed administration, methylprednisolone injection given in office to help with management of wheezing, will defer use of oral steroid course due to history of diabetes, last A1c greater than 7, prescribed albuterol inhaler, Tessalon and Promethazine DM to help calm coughing, may follow-up with urgent care as needed  Final Clinical Impressions(s) / UC Diagnoses   Final diagnoses:  None   Discharge Instructions   None    ED Prescriptions   None    PDMP not reviewed this encounter.   Hans Eden, NP 07/02/21 1424

## 2021-07-02 NOTE — ED Triage Notes (Addendum)
Patient states that she has not been feeling good for over a week now.  Patient c/o cough, sore throat, and congestion and fever that got worse on Thursday.

## 2021-07-02 NOTE — Discharge Instructions (Addendum)
Your symptoms today are most likely being caused by a virus and should steadily improve in time it can take up to 7 to 10 days before you truly start to see a turnaround however things will get better  We will check today to see if that virus is COVID, if positive you will be notified and antiviral medications intended for use, the antiviral medication is used to help lessen the severity and timeline of your illness  Your strep test was negative  Your cough is being worsened due to inflammation to the upper airways, you have been given a steroid injection here in the office to help calm this    You can take Tylenol and/or Ibuprofen as needed for fever reduction and pain relief.   For cough: honey 1/2 to 1 teaspoon (you can dilute the honey in water or another fluid).  You can also use guaifenesin and dextromethorphan for cough. You can use a humidifier for chest congestion and cough.  If you don't have a humidifier, you can sit in the bathroom with the hot shower running.      For sore throat: try warm salt water gargles, cepacol lozenges, throat spray, warm tea or water with lemon/honey, popsicles or ice, or OTC cold relief medicine for throat discomfort.   For congestion: take a daily anti-histamine like Zyrtec, Claritin, and a oral decongestant, such as pseudoephedrine.  You can also use Flonase 1-2 sprays in each nostril daily.   It is important to stay hydrated: drink plenty of fluids (water, gatorade/powerade/pedialyte, juices, or teas) to keep your throat moisturized and help further relieve irritation/discomfort.

## 2021-07-03 LAB — SARS CORONAVIRUS 2 (TAT 6-24 HRS): SARS Coronavirus 2: NEGATIVE

## 2021-10-03 ENCOUNTER — Other Ambulatory Visit: Payer: Self-pay | Admitting: Family Medicine

## 2021-10-03 DIAGNOSIS — Z1231 Encounter for screening mammogram for malignant neoplasm of breast: Secondary | ICD-10-CM

## 2021-11-02 ENCOUNTER — Ambulatory Visit
Admission: RE | Admit: 2021-11-02 | Discharge: 2021-11-02 | Disposition: A | Discharge status: Home / Self Care | Payer: No Typology Code available for payment source | Source: Ambulatory Visit | Attending: Family Medicine | Admitting: Family Medicine

## 2021-11-02 DIAGNOSIS — Z1231 Encounter for screening mammogram for malignant neoplasm of breast: Secondary | ICD-10-CM | POA: Diagnosis present

## 2022-01-19 ENCOUNTER — Ambulatory Visit: Payer: No Typology Code available for payment source | Admitting: General Practice

## 2022-01-19 ENCOUNTER — Encounter
Admission: RE | Disposition: A | Discharge status: Home / Self Care | Payer: Self-pay | Source: Home / Self Care | Attending: Gastroenterology

## 2022-01-19 ENCOUNTER — Encounter: Payer: Self-pay | Admitting: *Deleted

## 2022-01-19 ENCOUNTER — Ambulatory Visit
Admission: RE | Admit: 2022-01-19 | Discharge: 2022-01-19 | Disposition: A | Discharge status: Home / Self Care | Payer: No Typology Code available for payment source | Attending: Gastroenterology | Admitting: Gastroenterology

## 2022-01-19 DIAGNOSIS — I10 Essential (primary) hypertension: Secondary | ICD-10-CM | POA: Diagnosis not present

## 2022-01-19 DIAGNOSIS — K2970 Gastritis, unspecified, without bleeding: Secondary | ICD-10-CM | POA: Diagnosis not present

## 2022-01-19 DIAGNOSIS — G4730 Sleep apnea, unspecified: Secondary | ICD-10-CM | POA: Insufficient documentation

## 2022-01-19 DIAGNOSIS — K319 Disease of stomach and duodenum, unspecified: Secondary | ICD-10-CM | POA: Insufficient documentation

## 2022-01-19 DIAGNOSIS — K449 Diaphragmatic hernia without obstruction or gangrene: Secondary | ICD-10-CM | POA: Insufficient documentation

## 2022-01-19 DIAGNOSIS — R1013 Epigastric pain: Secondary | ICD-10-CM | POA: Diagnosis present

## 2022-01-19 DIAGNOSIS — E119 Type 2 diabetes mellitus without complications: Secondary | ICD-10-CM | POA: Insufficient documentation

## 2022-01-19 HISTORY — PX: ESOPHAGOGASTRODUODENOSCOPY (EGD) WITH PROPOFOL: SHX5813

## 2022-01-19 LAB — GLUCOSE, CAPILLARY: Glucose-Capillary: 178 mg/dL — ABNORMAL HIGH (ref 70–99)

## 2022-01-19 SURGERY — ESOPHAGOGASTRODUODENOSCOPY (EGD) WITH PROPOFOL
Anesthesia: General

## 2022-01-19 MED ORDER — LIDOCAINE HCL (CARDIAC) PF 100 MG/5ML IV SOSY
PREFILLED_SYRINGE | INTRAVENOUS | Status: DC | PRN
  Administered 2022-01-19: 100 mg via INTRAVENOUS

## 2022-01-19 MED ORDER — PROPOFOL 500 MG/50ML IV EMUL
INTRAVENOUS | Status: DC | PRN
  Administered 2022-01-19: 145 ug/kg/min via INTRAVENOUS

## 2022-01-19 MED ORDER — PROPOFOL 10 MG/ML IV BOLUS
INTRAVENOUS | Status: DC | PRN
  Administered 2022-01-19: 60 mg via INTRAVENOUS
  Administered 2022-01-19: 20 mg via INTRAVENOUS

## 2022-01-19 MED ORDER — GLYCOPYRROLATE 0.2 MG/ML IJ SOLN
INTRAMUSCULAR | Status: DC | PRN
  Administered 2022-01-19: .2 mg via INTRAVENOUS

## 2022-01-19 MED ORDER — DEXMEDETOMIDINE HCL IN NACL 200 MCG/50ML IV SOLN
INTRAVENOUS | Status: DC | PRN
  Administered 2022-01-19: 8 ug via INTRAVENOUS

## 2022-01-19 MED ORDER — SODIUM CHLORIDE 0.9 % IV SOLN
INTRAVENOUS | Status: DC
Start: 2022-01-19 — End: 2022-01-19

## 2022-01-19 NOTE — Anesthesia Preprocedure Evaluation (Signed)
Anesthesia Evaluation  Patient identified by MRN, date of birth, ID band Patient awake    Reviewed: Allergy & Precautions, NPO status , Patient's Chart, lab work & pertinent test results  History of Anesthesia Complications Negative for: history of anesthetic complications  Airway Mallampati: IV  TM Distance: >3 FB Neck ROM: full    Dental  (+) Teeth Intact, Dental Advidsory Given   Pulmonary sleep apnea , neg COPD, Current Smoker and Patient abstained from smoking.,    Pulmonary exam normal        Cardiovascular hypertension, (-) angina(-) Past MI and (-) CABG negative cardio ROS Normal cardiovascular exam     Neuro/Psych PSYCHIATRIC DISORDERS negative neurological ROS     GI/Hepatic Neg liver ROS,   Endo/Other  negative endocrine ROSdiabetes  Renal/GU negative Renal ROS  negative genitourinary   Musculoskeletal   Abdominal   Peds  Hematology negative hematology ROS (+)   Anesthesia Other Findings Past Medical History: No date: Depression No date: Diabetes mellitus without complication (HCC) No date: GERD (gastroesophageal reflux disease) No date: Hyperlipidemia No date: Hypertension 10/03/2017: Microalbuminuria due to type 2 diabetes mellitus (HCC) No date: Sleep apnea  Past Surgical History: 12/06/2015: COLONOSCOPY WITH PROPOFOL; N/A     Comment:  Procedure: COLONOSCOPY WITH PROPOFOL;  Surgeon: Midge Minium, MD;  Location: [facility] ENDOSCOPY;  Service: Endoscopy;              Laterality: N/A; 2007: DILATION AND CURETTAGE OF UTERUS  BMI    Body Mass Index: 44.63 kg/m      Reproductive/Obstetrics negative OB ROS                             Anesthesia Physical Anesthesia Plan  ASA: 3  Anesthesia Plan: General   Post-op Pain Management:    Induction: Intravenous  PONV Risk Score and Plan: Propofol infusion and TIVA  Airway Management Planned: Natural Airway  and Nasal Cannula  Additional Equipment:   Intra-op Plan:   Post-operative Plan:   Informed Consent: I have reviewed the patients History and Physical, chart, labs and discussed the procedure including the risks, benefits and alternatives for the proposed anesthesia with the patient or authorized representative who has indicated his/her understanding and acceptance.     Dental Advisory Given  Plan Discussed with: Anesthesiologist, CRNA and Surgeon  Anesthesia Plan Comments: (Patient consented for risks of anesthesia including but not limited to:  - adverse reactions to medications - risk of airway placement if required - damage to eyes, teeth, lips or other oral mucosa - nerve damage due to positioning  - sore throat or hoarseness - Damage to heart, brain, nerves, lungs, other parts of body or loss of life  Patient voiced understanding.)        Anesthesia Quick Evaluation

## 2022-01-19 NOTE — H&P (Signed)
Outpatient short stay form Pre-procedure 01/19/2022  Regis Bill, MD  Primary Physician: Marina Goodell, MD  Reason for visit:  Epigastric pain  History of present illness:    61 y/o lady with diabetes, obesity, and hypertension here for EGD for epigastric discomfort. No blood thinners. No family history of GI malignancies. No neck or abdominal surgeries.    Current Facility-Administered Medications:    0.9 %  sodium chloride infusion, , Intravenous, Continuous, Clotile Whittington, Rossie Muskrat, MD, Last Rate: 20 mL/hr at 01/19/22 1000, New Bag at 01/19/22 1000  Medications Prior to Admission  Medication Sig Dispense Refill Last Dose   aspirin (ASPIRIN ADULT LOW DOSE) 81 MG EC tablet Take 81 mg by mouth daily.    01/18/2022   FARXIGA 10 MG TABS tablet TAKE 10 MG BY MOUTH DAILY. 90 tablet 0 01/18/2022   hydrochlorothiazide (HYDRODIURIL) 25 MG tablet Take 1 tablet (25 mg total) by mouth daily. 30 tablet 0 01/18/2022   losartan (COZAAR) 100 MG tablet Take 1 tablet (100 mg total) by mouth daily. 90 tablet 1 01/18/2022   metFORMIN (GLUCOPHAGE) 1000 MG tablet    01/18/2022   albuterol (VENTOLIN HFA) 108 (90 Base) MCG/ACT inhaler Inhale 2 puffs into the lungs every 4 (four) hours as needed for wheezing or shortness of breath. (Patient not taking: Reported on 01/19/2022) 18 g 0 Not Taking   atorvastatin (LIPITOR) 40 MG tablet Take 1 tablet (40 mg total) by mouth at bedtime. 90 tablet 1    benzonatate (TESSALON) 100 MG capsule Take 1 capsule (100 mg total) by mouth every 8 (eight) hours. 21 capsule 0    Cholecalciferol (VITAMIN D PO) Take by mouth daily.      cyanocobalamin (,VITAMIN B-12,) 1000 MCG/ML injection Inject 1 mL (1,000 mcg total) into the muscle every 30 (thirty) days. 1 mL 11    Cyanocobalamin (B-12) 1000 MCG CAPS Take 1 capsule by mouth as needed.      cyclobenzaprine (FLEXERIL) 10 MG tablet Take 1 tablet (10 mg total) by mouth 3 (three) times daily as needed for muscle spasms. 30 tablet 0     Diethylpropion HCl 25 MG TABS Take 1 tablet (25 mg total) by mouth in the morning. 30 tablet 1    LEVEMIR FLEXTOUCH 100 UNIT/ML Pen Inject 30 Units into the skin daily.       loratadine (CLARITIN) 10 MG tablet TAKE 1 TABLET BY MOUTH EVERY DAY 30 tablet 11    promethazine-dextromethorphan (PROMETHAZINE-DM) 6.25-15 MG/5ML syrup Take 5 mLs by mouth 4 (four) times daily as needed for cough. 118 mL 0    topiramate (TOPAMAX) 25 MG tablet Take 1 tablet (25 mg total) by mouth 2 (two) times daily. 60 tablet 5    TRULICITY 1.5 MG/0.5ML SOPN Inject into the skin once a week. (Patient not taking: Reported on 01/19/2022)   Not Taking     Allergies  Allergen Reactions   Actos [Pioglitazone]     bloating   Victoza [Liraglutide] Rash    rash     Past Medical History:  Diagnosis Date   Depression    Diabetes mellitus without complication (HCC)    GERD (gastroesophageal reflux disease)    Hyperlipidemia    Hypertension    Microalbuminuria due to type 2 diabetes mellitus (HCC) 10/03/2017   Sleep apnea     Review of systems:  Otherwise negative.    Physical Exam  Gen: Alert, oriented. Appears stated age.  HEENT:PERRLA. Lungs: No respiratory distress CV: RRR Abd:  soft, benign, no masses Ext: No edema    Planned procedures: Proceed with EGD. The patient understands the nature of the planned procedure, indications, risks, alternatives and potential complications including but not limited to bleeding, infection, perforation, damage to internal organs and possible oversedation/side effects from anesthesia. The patient agrees and gives consent to proceed.  Please refer to procedure notes for findings, recommendations and patient disposition/instructions.     Regis Bill, MD [facility]

## 2022-01-19 NOTE — Interval H&P Note (Signed)
History and Physical Interval Note:  01/19/2022 11:16 AM  Ariel Johnston  has presented today for surgery, with the diagnosis of epigastric pain.  The various methods of treatment have been discussed with the patient and family. After consideration of risks, benefits and other options for treatment, the patient has consented to  Procedure(s): ESOPHAGOGASTRODUODENOSCOPY (EGD) WITH PROPOFOL (N/A) as a surgical intervention.  The patient's history has been reviewed, patient examined, no change in status, stable for surgery.  I have reviewed the patient's chart and labs.  Questions were answered to the patient's satisfaction.     Regis Bill  Ok to proceed with EGD

## 2022-01-19 NOTE — Op Note (Signed)
[facility] Patient Name: Ariel Johnston Procedure Date: 01/19/2022 11:08 AM MRN: [id number] Account #: [id number] Date of Birth: [birth year] Admit Type: Ambulatory Age: 61 Room: [facility] ENDO ROOM 3 Gender: Female Note Status: Finalized Instrument Name: Upper Endoscope [id number] Procedure:             Upper GI endoscopy Indications:           Epigastric abdominal pain Providers:             Andrey Farmer MD, MD Referring MD:          Sofie Hartigan (Referring MD) Medicines:             Monitored Anesthesia Care Complications:         No immediate complications. Estimated blood loss:                         Minimal. Procedure:             Pre-Anesthesia Assessment:                        - Prior to the procedure, a History and Physical was                         performed, and patient medications and allergies were                         reviewed. The patient is competent. The risks and                         benefits of the procedure and the sedation options and                         risks were discussed with the patient. All questions                         were answered and informed consent was obtained.                         Patient identification and proposed procedure were                         verified by the physician, the nurse, the                         anesthesiologist, the anesthetist and the technician                         in the endoscopy suite. Mental Status Examination:                         alert and oriented. Airway Examination: normal                         oropharyngeal airway and neck mobility. Respiratory                         Examination: clear to auscultation. CV Examination:  normal. Prophylactic Antibiotics: The patient does not                         require prophylactic antibiotics. Prior                         Anticoagulants: The patient has taken no previous                          anticoagulant or antiplatelet agents. ASA Grade                         Assessment: III - A patient with severe systemic                         disease. After reviewing the risks and benefits, the                         patient was deemed in satisfactory condition to                         undergo the procedure. The anesthesia plan was to use                         monitored anesthesia care (MAC). Immediately prior to                         administration of medications, the patient was                         re-assessed for adequacy to receive sedatives. The                         heart rate, respiratory rate, oxygen saturations,                         blood pressure, adequacy of pulmonary ventilation, and                         response to care were monitored throughout the                         procedure. The physical status of the patient was                         re-assessed after the procedure.                        After obtaining informed consent, the endoscope was                         passed under direct vision. Throughout the procedure,                         the patient's blood pressure, pulse, and oxygen                         saturations were monitored continuously. The Endoscope  was introduced through the mouth, and advanced to the                         second part of duodenum. The upper GI endoscopy was                         accomplished without difficulty. The patient tolerated                         the procedure well. Findings:      A small hiatal hernia was present.      The exam of the esophagus was otherwise normal.      Patchy minimal inflammation characterized by erythema was found in the       gastric antrum. Biopsies were taken with a cold forceps for Helicobacter       pylori testing. Estimated blood loss was minimal.      The exam of the stomach was otherwise normal.      The examined duodenum was  normal. Impression:            - Small hiatal hernia.                        - Gastritis. Biopsied.                        - Normal examined duodenum. Recommendation:        - Discharge patient to home.                        - Resume previous diet.                        - Continue present medications.                        - Await pathology results.                        - Return to referring physician as previously                         scheduled. Procedure Code(s):     --- Professional ---                        [id number], Esophagogastroduodenoscopy, flexible,                         transoral; with biopsy, single or multiple Diagnosis Code(s):     --- Professional ---                        K44.9, Diaphragmatic hernia without obstruction or                         gangrene                        K29.70, Gastritis, unspecified, without bleeding                        R10.13, Epigastric pain CPT copyright 2019 [employer]. All  rights reserved. The codes documented in this report are preliminary and upon coder review may  be revised to meet current compliance requirements. Andrey Farmer MD, MD 01/19/2022 11:30:45 AM Number of Addenda: 0 Note Initiated On: 01/19/2022 11:08 AM Estimated Blood Loss:  Estimated blood loss was minimal.      [facility]

## 2022-01-19 NOTE — Anesthesia Procedure Notes (Signed)
Procedure Name: General with mask airway Date/Time: 01/19/2022 11:22 AM  Performed by: Mohammed Kindle, CRNAPre-anesthesia Checklist: Emergency Drugs available, Patient identified, Suction available and Patient being monitored Patient Re-evaluated:Patient Re-evaluated prior to induction Oxygen Delivery Method: Simple face mask Induction Type: IV induction Placement Confirmation: positive ETCO2, CO2 detector and breath sounds checked- equal and bilateral Dental Injury: Teeth and Oropharynx as per pre-operative assessment

## 2022-01-19 NOTE — Transfer of Care (Signed)
Immediate Anesthesia Transfer of Care Note  Patient: Ariel Johnston  Procedure(s) Performed: ESOPHAGOGASTRODUODENOSCOPY (EGD) WITH PROPOFOL  Patient Location: Endoscopy Unit  Anesthesia Type:General  Level of Consciousness: awake, drowsy and patient cooperative  Airway & Oxygen Therapy: Patient Spontanous Breathing and Patient connected to face mask oxygen  Post-op Assessment: Report given to RN and Post -op Vital signs reviewed and stable  Post vital signs: Reviewed and stable  Last Vitals:  Vitals Value Taken Time  BP 107/71 01/19/22 1143  Temp 35.4 C 01/19/22 1134  Pulse 83 01/19/22 1150  Resp 19 01/19/22 1150  SpO2 100 % 01/19/22 1150  Vitals shown include unvalidated device data.  Last Pain:  Vitals:   01/19/22 1134  TempSrc: Temporal  PainSc: Asleep         Complications: No notable events documented.

## 2022-01-22 ENCOUNTER — Encounter: Payer: Self-pay | Admitting: Gastroenterology

## 2022-01-22 LAB — SURGICAL PATHOLOGY

## 2022-01-22 NOTE — Anesthesia Postprocedure Evaluation (Signed)
Anesthesia Post Note  Patient: Ariel Johnston  Procedure(s) Performed: ESOPHAGOGASTRODUODENOSCOPY (EGD) WITH PROPOFOL  Patient location during evaluation: Endoscopy Anesthesia Type: General Level of consciousness: awake and alert Pain management: pain level controlled Vital Signs Assessment: post-procedure vital signs reviewed and stable Respiratory status: spontaneous breathing, nonlabored ventilation, respiratory function stable and patient connected to nasal cannula oxygen Cardiovascular status: blood pressure returned to baseline and stable Postop Assessment: no apparent nausea or vomiting Anesthetic complications: no   No notable events documented.   Last Vitals:  Vitals:   01/19/22 1154 01/19/22 1200  BP: 119/79 127/82  Pulse:    Resp:    Temp:    SpO2: 100% 100%    Last Pain:  Vitals:   01/20/22 0839  TempSrc:   PainSc: 0-No pain                 Stephanie Coup

## 2023-01-19 ENCOUNTER — Encounter: Payer: Self-pay | Admitting: Emergency Medicine

## 2023-01-19 ENCOUNTER — Ambulatory Visit (INDEPENDENT_AMBULATORY_CARE_PROVIDER_SITE_OTHER): Payer: No Typology Code available for payment source

## 2023-01-19 ENCOUNTER — Ambulatory Visit
Admission: EM | Admit: 2023-01-19 | Discharge: 2023-01-19 | Disposition: A | Discharge status: Home / Self Care | Payer: No Typology Code available for payment source | Attending: Emergency Medicine | Admitting: Emergency Medicine

## 2023-01-19 DIAGNOSIS — M79641 Pain in right hand: Secondary | ICD-10-CM | POA: Diagnosis not present

## 2023-01-19 DIAGNOSIS — M109 Gout, unspecified: Secondary | ICD-10-CM | POA: Diagnosis present

## 2023-01-19 LAB — URIC ACID: Uric Acid, Serum: 4.1 mg/dL (ref 2.5–7.1)

## 2023-01-19 MED ORDER — COLCHICINE 0.6 MG PO TABS: 0.6000 mg | ORAL_TABLET | Freq: Every day | ORAL | 0 refills | Status: AC

## 2023-01-19 NOTE — ED Provider Notes (Signed)
[facility]    CSN: [id number] Arrival date & time: 01/19/23  0917      History   Chief Complaint Chief Complaint  Patient presents with   Hand Pain    right    HPI Ariel Johnston is a 62 y.o. female.   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  She denies any falls or trauma.  She does not have a history of gout.  Range of motion is decreased secondary to swelling and pain.  Past Medical History:  Diagnosis Date   Depression    Diabetes mellitus without complication (HCC)    GERD (gastroesophageal reflux disease)    Hyperlipidemia    Hypertension    Microalbuminuria due to type 2 diabetes mellitus (HCC) 10/03/2017   Sleep apnea     Patient Active Problem List   Diagnosis Date Noted   Obesity, morbid, BMI 40.0-49.9 (HCC) 06/09/2018   Microalbuminuria due to type 2 diabetes mellitus (HCC) 10/03/2017   Elevated hemoglobin (HCC) 12/13/2016   Chronic pain of right hip 12/11/2016   Dysphagia 04/17/2016   Tobacco abuse 01/17/2016   Special screening for malignant neoplasms, colon    Essential hypertension 01/20/2015   Hyperlipidemia 01/20/2015   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 benign 11/03/2014   Back ache 11/03/2014   Clinical depression 11/03/2014   Acid reflux 11/03/2014   Obstructive apnea 11/03/2014   Polycystic ovaries 11/03/2014   Allergic rhinitis, seasonal 11/03/2014   Type II diabetes mellitus, uncontrolled 03/08/2010   Dyshidrosis 03/08/2010   Combined fat and carbohydrate induced hyperlipemia 08/18/2008    Past Surgical History:  Procedure Laterality Date   COLONOSCOPY WITH PROPOFOL N/A 12/06/2015   Procedure: COLONOSCOPY WITH  PROPOFOL;  Surgeon: Midge Minium, MD;  Location: [facility];  Service: Endoscopy;  Laterality: N/A;   DILATION AND CURETTAGE OF UTERUS  2007   ESOPHAGOGASTRODUODENOSCOPY (EGD) WITH PROPOFOL N/A 01/19/2022   Procedure: ESOPHAGOGASTRODUODENOSCOPY (EGD) WITH PROPOFOL;  Surgeon: Regis Bill, MD;  Location: [facility];  Service: Endoscopy;  Laterality: N/A;    OB History     Gravida  0   Para  0   Term  0   Preterm  0   AB  0   Living  0      SAB  0   IAB  0   Ectopic  0   Multiple  0   Live Births  0            Home Medications    Prior to Admission medications   Medication Sig Start Date End Date Taking? Authorizing Provider  colchicine 0.6 MG tablet Take 1 tablet (0.6 mg total) by mouth daily. Take 2 tablets at the onset of gout symptoms.  You may repeat dosing 1 hour later with a single tablet as needed. 01/19/23  Yes Becky Augusta, NP  albuterol (VENTOLIN HFA) 108 (90 Base) MCG/ACT inhaler Inhale 2 puffs into the lungs every 4 (four) hours as needed for wheezing or shortness of breath. Patient not taking: Reported on 01/19/2022 07/02/21   Valinda Hoar, NP  aspirin (ASPIRIN ADULT LOW DOSE) 81 MG EC tablet Take 81 mg by mouth daily.  04/07/14   [provider]  atorvastatin (LIPITOR) 40  MG tablet Take 1 tablet (40 mg total) by mouth at bedtime. 12/09/18   Poulose, Percell Belt, NP  benzonatate (TESSALON) 100 MG capsule Take 1 capsule (100 mg total) by mouth every 8 (eight) hours. 07/02/21   Valinda Hoar, NP  Cholecalciferol (VITAMIN D PO) Take by mouth daily.    [provider]  cyanocobalamin (,VITAMIN B-12,) 1000 MCG/ML injection Inject 1 mL (1,000 mcg total) into the muscle every 30 (thirty) days. 06/16/19   Nadara Mustard, MD  Cyanocobalamin (B-12) 1000 MCG CAPS Take 1 capsule by mouth as needed.    [provider]  cyclobenzaprine (FLEXERIL) 10 MG tablet Take 1 tablet (10 mg total) by mouth 3 (three) times daily as  needed for muscle spasms. 06/06/19   Payton Mccallum, MD  Diethylpropion HCl 25 MG TABS Take 1 tablet (25 mg total) by mouth in the morning. 08/26/19   Nadara Mustard, MD  FARXIGA 10 MG TABS tablet TAKE 10 MG BY MOUTH DAILY. 06/01/19   Doren Custard, FNP  hydrochlorothiazide (HYDRODIURIL) 25 MG tablet Take 1 tablet (25 mg total) by mouth daily. 05/12/19   Doren Custard, FNP  LEVEMIR FLEXTOUCH 100 UNIT/ML Pen Inject 30 Units into the skin daily.  11/11/18   [provider]  loratadine (CLARITIN) 10 MG tablet TAKE 1 TABLET BY MOUTH EVERY DAY 07/24/18   Poulose, Percell Belt, NP  losartan (COZAAR) 100 MG tablet Take 1 tablet (100 mg total) by mouth daily. 12/09/18   Poulose, Percell Belt, NP  metFORMIN (GLUCOPHAGE) 1000 MG tablet  06/19/18   [provider]  promethazine-dextromethorphan (PROMETHAZINE-DM) 6.25-15 MG/5ML syrup Take 5 mLs by mouth 4 (four) times daily as needed for cough. 07/02/21   White, Elita Boone, NP  topiramate (TOPAMAX) 25 MG tablet Take 1 tablet (25 mg total) by mouth 2 (two) times daily. 08/26/19   Nadara Mustard, MD  TRULICITY 1.5 MG/0.5ML SOPN Inject into the skin once a week. Patient not taking: Reported on 01/19/2022    [provider]    Family History Family History  Problem Relation Age of Onset   Hypertension Brother    Kidney disease Brother    Stroke Father    Hypertension Maternal Grandmother    Stroke Maternal Grandmother    Cancer Paternal Grandmother        colon   Diabetes Paternal Grandfather    Heart disease Paternal Grandfather    Hypertension Paternal Grandfather    Breast cancer Neg Hx    Ovarian cancer Neg Hx     Social History Social History   Tobacco Use   Smoking status: Every Day    Current packs/day: 0.50    Average packs/day: 0.5 packs/day for 35.0 years (17.5 ttl pk-yrs)    Types: Cigarettes   Smokeless tobacco: Never  Vaping Use   Vaping status: Never Used  Substance Use Topics   Alcohol use: No     Alcohol/week: 0.0 standard drinks of alcohol   Drug use: No     Allergies   Actos [pioglitazone] and Victoza [liraglutide]   Review of Systems Review of Systems  Constitutional:  Negative for fever.  Musculoskeletal:  Positive for arthralgias and joint swelling.  Skin:  Positive for color change.     Physical Exam Triage Vital Signs ED Triage Vitals  Encounter Vitals Group     BP 01/19/23 1028 137/81     Systolic BP Percentile --      Diastolic BP Percentile --  Pulse Rate 01/19/23 1028 78     Resp 01/19/23 1028 15     Temp 01/19/23 1028 97.6 F (36.4 C)     Temp Source 01/19/23 1028 Oral     SpO2 01/19/23 1028 95 %     Weight 01/19/23 1027 244 lb 0.8 oz (110.7 kg)     Height 01/19/23 1027 5\' 2"  (1.575 m)     Head Circumference --      Peak Flow --      Pain Score 01/19/23 1027 9     Pain Loc --      Pain Education --      Exclude from Growth Chart --    No data found.  Updated Vital Signs BP 137/81 (BP Location: Left Arm)   Pulse 78   Temp 97.6 F (36.4 C) (Oral)   Resp 15   Ht 5\' 2"  (1.575 m)   Wt 244 lb 0.8 oz (110.7 kg)   SpO2 95%   BMI 44.64 kg/m   Visual Acuity Right Eye Distance:   Left Eye Distance:   Bilateral Distance:    Right Eye Near:   Left Eye Near:    Bilateral Near:     Physical Exam Vitals and nursing note reviewed.  Constitutional:      Appearance: Normal appearance. She is not ill-appearing.  HENT:     Head: Normocephalic and atraumatic.  Musculoskeletal:        General: Swelling and tenderness present. No deformity or signs of injury.  Skin:    General: Skin is warm and dry.     Capillary Refill: Capillary refill takes less than 2 seconds.     Findings: Erythema present.  Neurological:     General: No focal deficit present.     Mental Status: She is alert and oriented to person, place, and time.     Sensory: No sensory deficit.      UC Treatments / Results  Labs (all labs ordered are listed, but only abnormal  results are displayed) Labs Reviewed  URIC ACID    EKG   Radiology DG Hand Complete Right  Result Date: 01/19/2023 CLINICAL DATA:  Pain, redness, and swelling involving the fifth MCP joint and fifth metacarpal beginning yesterday without reported trauma. EXAM: RIGHT HAND - COMPLETE 3+ VIEW COMPARISON:  None Available. FINDINGS: No acute fracture or dislocation is identified. There is a 3 mm focus of periarticular calcification at the ulnar aspect of the fifth MCP joint, likely with mild overlying soft tissue swelling. No osseous erosion is identified. Moderate degenerative spurring is noted at the D IP joint of the index finger. IMPRESSION: 1. 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 2. No acute fracture or destructive osseous process. Electronically Signed   By: Sebastian Ache M.D.   On: 01/19/2023 11:28    Procedures Procedures (including critical care time)  Medications Ordered in UC Medications - No data to display  Initial Impression / Assessment and Plan / UC Course  I have reviewed the triage vital signs and the nursing notes.  Pertinent labs & imaging results that were available during my care of the patient were reviewed by me and considered in my medical decision making (see chart for details).   Patient is a pleasant, nontoxic-appearing 62 year old female presenting for evaluation of pain and swelling at the MCP joint of right fifth finger that started yesterday.   As you can see in  image above, there is some erythema and edema to the MCP joint as well as the proximal aspect of the proximal phalanx and the distal aspect of the fifth metacarpal.  The area is warm and tender to touch.  Patient has no change in sensation of the distal fingertip but she does have limited flexion secondary to pain and swelling.  She does not have a history of gout though she does have risk factors to  include obesity, diuretic use, and being on low-dose baby aspirin.  I will obtain radiograph to rule out any bony abnormality as well as uric acid level to evaluate for the presence of gout.  Right hand x-rays independently reviewed and evaluated by me.  Pression: There is a questionable osteophyte on the medial aspect of the proximal metaphysis of the proximal phalanx.  Only visualized in the AP view.  No other bony abnormalities noted.  Radiology overread is pending. Radiology impression states there is a small focus of periarticular calcification at the fifth MCP joint, possibly with electing hydroxyalatite deposition disease with this history.  Uric acid is normal at 4.1 though the specimen was hemolyzed which may affect the reading.  I will discharge patient with a diagnosis of gout and start her on colchicine 0.6 mg, 2 tablets at the onset of pain and repeat 1 tablet 1 hour later as needed.  Final Clinical Impressions(s) / UC Diagnoses   Final diagnoses:  Acute gout of right hand, unspecified cause     Discharge Instructions      Take the colchicine for treatment of your gout.  You will take 2 tablets at the onset and repeat dosing in 1 hour if your pain is not improved.  You may then take 1 tablet daily as needed for pain and inflammation..  Follow the low purine diet guide given your discharge paperwork.  This is a list of foods that could be contributing to your gout attacks.  Red meat, alcohol, poor water intake, and leafy green vegetables are the most common contributors to gout flares.  Increase your oral fluid intake of water to at least 128 ounces to help your body flush the uric acid from your system.  I would recommend that you follow-up with your primary care provider if your symptoms or not improving.  You should follow-up with anyhow a week after the pain and inflammation have resolved to have your uric acid levels checked.  You may need to start on preventative medication  for gout.     ED Prescriptions     Medication Sig Dispense Auth. Provider   colchicine 0.6 MG tablet Take 1 tablet (0.6 mg total) by mouth daily. Take 2 tablets at the onset of gout symptoms.  You may repeat dosing 1 hour later with a single tablet as needed. 30 tablet Becky Augusta, NP      PDMP not reviewed this encounter.   Becky Augusta, NP 01/19/23 1145

## 2023-01-19 NOTE — ED Triage Notes (Signed)
Patient reports right hand pain and swelling that started yesterday.  Patient denies injury or fall.

## 2023-01-19 NOTE — Discharge Instructions (Addendum)
Take the colchicine for treatment of your gout.  You will take 2 tablets at the onset and repeat dosing in 1 hour if your pain is not improved.  You may then take 1 tablet daily as needed for pain and inflammation..  Follow the low purine diet guide given your discharge paperwork.  This is a list of foods that could be contributing to your gout attacks.  Red meat, alcohol, poor water intake, and leafy green vegetables are the most common contributors to gout flares.  Increase your oral fluid intake of water to at least 128 ounces to help your body flush the uric acid from your system.  I would recommend that you follow-up with your primary care provider if your symptoms or not improving.  You should follow-up with anyhow a week after the pain and inflammation have resolved to have your uric acid levels checked.  You may need to start on preventative medication for gout.

## 2023-01-21 ENCOUNTER — Other Ambulatory Visit: Payer: Self-pay | Admitting: Family Medicine

## 2023-01-21 DIAGNOSIS — Z1231 Encounter for screening mammogram for malignant neoplasm of breast: Secondary | ICD-10-CM

## 2023-01-30 ENCOUNTER — Ambulatory Visit
Admission: RE | Admit: 2023-01-30 | Discharge: 2023-01-30 | Disposition: A | Discharge status: Home / Self Care | Payer: No Typology Code available for payment source | Source: Ambulatory Visit | Attending: Family Medicine | Admitting: Family Medicine

## 2023-01-30 DIAGNOSIS — Z1231 Encounter for screening mammogram for malignant neoplasm of breast: Secondary | ICD-10-CM | POA: Insufficient documentation

## 2024-01-15 ENCOUNTER — Other Ambulatory Visit: Payer: Self-pay | Admitting: Family Medicine

## 2024-01-15 DIAGNOSIS — Z1231 Encounter for screening mammogram for malignant neoplasm of breast: Secondary | ICD-10-CM

## 2024-02-25 ENCOUNTER — Ambulatory Visit
Admission: RE | Admit: 2024-02-25 | Discharge: 2024-02-25 | Disposition: A | Discharge status: Home / Self Care | Source: Ambulatory Visit | Attending: Family Medicine | Admitting: Family Medicine

## 2024-02-25 DIAGNOSIS — Z1231 Encounter for screening mammogram for malignant neoplasm of breast: Secondary | ICD-10-CM | POA: Insufficient documentation
